# Patient Record
Sex: Male | Born: 1970 | Race: Black or African American | Hispanic: No | Smoking: Light tobacco smoker
Health system: Southern US, Community
[De-identification: ages and names within clinical notes are randomized; demographics above are authoritative.]

## PROBLEM LIST (undated history)

## (undated) DIAGNOSIS — I4891 Unspecified atrial fibrillation: Secondary | ICD-10-CM

## (undated) DIAGNOSIS — I10 Essential (primary) hypertension: Secondary | ICD-10-CM

## (undated) DIAGNOSIS — G4733 Obstructive sleep apnea (adult) (pediatric): Secondary | ICD-10-CM

---

## 1999-04-25 ENCOUNTER — Emergency Department (HOSPITAL_COMMUNITY): Admission: EM | Admit: 1999-04-25 | Discharge: 1999-04-25 | Payer: Self-pay | Admitting: Emergency Medicine

## 2007-11-16 ENCOUNTER — Ambulatory Visit (HOSPITAL_BASED_OUTPATIENT_CLINIC_OR_DEPARTMENT_OTHER): Admission: RE | Admit: 2007-11-16 | Discharge: 2007-11-16 | Payer: Self-pay | Admitting: Internal Medicine

## 2007-11-24 ENCOUNTER — Ambulatory Visit: Payer: Self-pay | Admitting: Internal Medicine

## 2011-02-21 NOTE — Procedures (Signed)
Jeffery Bowman, Jeffery Bowman          ACCOUNT NO.:  [id number]   MEDICAL RECORD NO.:  [id number]          PATIENT TYPE:  OUT   LOCATION:  SLEEP CENTER                 FACILITY:  [facility]   PHYSICIAN:  Clinton D. Maple Hudson, MD, FCCP, FACPDATE OF BIRTH:  31-May[birth year]1970   DATE OF STUDY:  11/16/2007                            NOCTURNAL POLYSOMNOGRAM   REFERRING PHYSICIAN:   REFERRING PHYSICIAN:  Ralene Ok, M.D.   INDICATION FOR STUDY:  Hypersomnia with sleep apnea.  BMI 46.4.  Weight  344 pounds.  Height 72 inches.  Neck 19 inches.   EPWORTH SLEEPINESS SCORE:   MEDICATIONS:  No home medications were charted.   SLEEP ARCHITECTURE:  Split study protocol.  During the diagnostic phase,  total sleep time was 140 minutes with sleep efficiency of 61.8%.  Stage  I was 13.2%.  Stage II 73.6%.  Stage III 3.6%.  REM 9.6% of total sleep  time.  Sleep latency 10.5 minutes.  REM latency 175.5 minutes.  Awake  after sleep onset 60 minutes.  Arousal index 16.3.  No bedtime  medication was taken.   RESPIRATORY DATA:  Split study protocol.  Apnea-hypopnea index (AHI) at  64.7 per hour, indicating severe obstructive sleep apnea/hypopnea  syndrome.  This included 26 obstructive apneas and 125 hypopneas before  CPAP.  The events were more common, while supine is expected, but not  limited to supine sleep position.  CPAP was titrated to a best pressure  of 16 CWP, AHI 0 per hour.  A large Mirage Quattro mask was used with  heated humidifier.   OXYGEN DATA:  Loud snoring before CPAP with oxygen desaturation to a  nadir of 71%.  Mean oxygen saturation after CPAP control was 95.7% on  room air.   CARDIAC DATA:  Normal sinus rhythm.   MOVEMENT-PARASOMNIA:  No significant movement disturbance.  Bathroom x1.   IMPRESSIONS-RECOMMENDATIONS:  1. Severe obstructive sleep apnea/hypopnea syndrome, apnea-hypopnea      index (AHI) 64.7 per hour with nonpositional events, somewhat more      frequent while supine, loud  snoring, and oxygen desaturation to a      nadir of 71%.  2. Successful CPAP titration to 16 CWP, AHI 3.4 per hour.  A large      Mirage Quattro mask was used with heated humidifier.      Clinton D. Maple Hudson, MD, [facility], FACP  Diplomate, Biomedical engineer of Sleep Medicine  Electronically Signed     CDY/MEDQ  D:  11/24/2007 10:13:06  T:  11/25/2007 16:47:57  Job:  [id number]   cc:   Ralene Ok, M.D.  Fax: [phone]

## 2017-03-28 ENCOUNTER — Other Ambulatory Visit: Payer: Self-pay | Admitting: Gastroenterology

## 2017-05-11 ENCOUNTER — Encounter (HOSPITAL_COMMUNITY): Payer: Self-pay | Admitting: *Deleted

## 2017-05-11 ENCOUNTER — Ambulatory Visit (HOSPITAL_COMMUNITY): Payer: BLUE CROSS/BLUE SHIELD | Admitting: Anesthesiology

## 2017-05-11 ENCOUNTER — Encounter (HOSPITAL_COMMUNITY)
Admission: RE | Disposition: A | Discharge status: Home / Self Care | Payer: Self-pay | Source: Ambulatory Visit | Attending: Gastroenterology

## 2017-05-11 ENCOUNTER — Ambulatory Visit (HOSPITAL_COMMUNITY)
Admission: RE | Admit: 2017-05-11 | Discharge: 2017-05-11 | Disposition: A | Discharge status: Home / Self Care | Payer: BLUE CROSS/BLUE SHIELD | Source: Ambulatory Visit | Attending: Gastroenterology | Admitting: Gastroenterology

## 2017-05-11 DIAGNOSIS — Z79899 Other long term (current) drug therapy: Secondary | ICD-10-CM | POA: Insufficient documentation

## 2017-05-11 DIAGNOSIS — Z6841 Body Mass Index (BMI) 40.0 and over, adult: Secondary | ICD-10-CM | POA: Insufficient documentation

## 2017-05-11 DIAGNOSIS — I10 Essential (primary) hypertension: Secondary | ICD-10-CM | POA: Insufficient documentation

## 2017-05-11 DIAGNOSIS — F17200 Nicotine dependence, unspecified, uncomplicated: Secondary | ICD-10-CM | POA: Insufficient documentation

## 2017-05-11 DIAGNOSIS — Z1211 Encounter for screening for malignant neoplasm of colon: Secondary | ICD-10-CM | POA: Insufficient documentation

## 2017-05-11 HISTORY — DX: Essential (primary) hypertension: I10

## 2017-05-11 HISTORY — PX: COLONOSCOPY WITH PROPOFOL: SHX5780

## 2017-05-11 SURGERY — COLONOSCOPY WITH PROPOFOL
Anesthesia: Monitor Anesthesia Care

## 2017-05-11 MED ORDER — LACTATED RINGERS IV SOLN
INTRAVENOUS | Status: DC
  Administered 2017-05-11: 07:00:00 via INTRAVENOUS

## 2017-05-11 MED ORDER — PROPOFOL 10 MG/ML IV BOLUS
INTRAVENOUS | Status: DC | PRN
  Administered 2017-05-11: 40 mg via INTRAVENOUS
  Administered 2017-05-11 (×7): 20 mg via INTRAVENOUS
  Administered 2017-05-11: 40 mg via INTRAVENOUS
  Administered 2017-05-11 (×7): 20 mg via INTRAVENOUS
  Administered 2017-05-11: 40 mg via INTRAVENOUS

## 2017-05-11 MED ORDER — PROPOFOL 10 MG/ML IV BOLUS
INTRAVENOUS | Status: AC
  Filled 2017-05-11: qty 40

## 2017-05-11 MED ORDER — SODIUM CHLORIDE 0.9 % IV SOLN: INTRAVENOUS | Status: DC

## 2017-05-11 SURGICAL SUPPLY — 22 items

## 2017-05-11 NOTE — Op Note (Signed)
[facility] Patient Name: Jeffery Bowman Procedure Date: 05/11/2017 MRN: [id number] Attending MD: Jeani HawkingPatrick Jancarlo Biermann , MD Date of Birth: [birth year] CSN: [id number] Age: 46 Admit Type: Outpatient Procedure:                Colonoscopy Indications:              Screening for colorectal malignant neoplasm Providers:                Jeani HawkingPatrick Zayvien Canning, MD, Omelia BlackwaterShelby Carpenter RN, RN, Margo AyeValeria                            McKoy, Technician Referring MD:              Medicines:                Propofol per Anesthesia Complications:            No immediate complications. Estimated Blood Loss:     Estimated blood loss: none. Procedure:                Pre-Anesthesia Assessment:                           - Prior to the procedure, a History and Physical                            was performed, and patient medications and                            allergies were reviewed. The patient's tolerance of                            previous anesthesia was also reviewed. The risks                            and benefits of the procedure and the sedation                            options and risks were discussed with the patient.                            All questions were answered, and informed consent                            was obtained. Prior Anticoagulants: The patient has                            taken no previous anticoagulant or antiplatelet                            agents. ASA Grade Assessment: II - A patient with                            mild systemic disease. After reviewing the risks  and benefits, the patient was deemed in                            satisfactory condition to undergo the procedure.                           - Sedation was administered by an anesthesia                            professional. Deep sedation was attained.                           After obtaining informed consent, the colonoscope                            was passed under  direct vision. Throughout the                            procedure, the patient's blood pressure, pulse, and                            oxygen saturations were monitored continuously. The                            Colonoscope was introduced through the anus and                            advanced to the the cecum, identified by                            appendiceal orifice and ileocecal valve. The                            colonoscopy was performed without difficulty. The                            patient tolerated the procedure well. The quality                            of the bowel preparation was excellent. The                            ileocecal valve, appendiceal orifice, and rectum                            were photographed. Scope In: 8:28:35 AM Scope Out: 8:43:11 AM Scope Withdrawal Time: 0 hours 10 minutes 40 seconds  Total Procedure Duration: 0 hours 14 minutes 36 seconds  Findings:      The entire examined colon appeared normal. Impression:               - The entire examined colon is normal.                           - No specimens collected. Moderate Sedation:  N/A- Per Anesthesia Care Recommendation:           - Patient has a contact number available for                            emergencies. The signs and symptoms of potential                            delayed complications were discussed with the                            patient. Return to normal activities tomorrow.                            Written discharge instructions were provided to the                            patient.                           - Resume previous diet.                           - Continue present medications.                           - Repeat colonoscopy in 10 years for surveillance. Procedure Code(s):        --- Professional ---                           [phone], Colonoscopy, flexible; diagnostic, including                            collection of specimen(s) by brushing or  washing,                            when performed (separate procedure) Diagnosis Code(s):        --- Professional ---                           Z12.11, Encounter for screening for malignant                            neoplasm of colon CPT copyright 2016 American Medical Association. All rights reserved. The codes documented in this report are preliminary and upon coder review may  be revised to meet current compliance requirements. Jeani Hawking, MD Jeani Hawking, MD 05/11/2017 8:47:43 AM This report has been signed electronically. Number of Addenda: 0

## 2017-05-11 NOTE — Anesthesia Postprocedure Evaluation (Signed)
Anesthesia Post Note  Patient: Mining engineer  Procedure(s) Performed: Procedure(s) (LRB): COLONOSCOPY WITH PROPOFOL (N/A)     Patient location during evaluation: Endoscopy Anesthesia Type: MAC Level of consciousness: awake and alert Pain management: pain level controlled Vital Signs Assessment: post-procedure vital signs reviewed and stable Respiratory status: spontaneous breathing, nonlabored ventilation, respiratory function stable and patient connected to nasal cannula oxygen Cardiovascular status: stable and blood pressure returned to baseline Anesthetic complications: no    Last Vitals:  Vitals:   05/11/17 0848 05/11/17 0900  BP: 119/77 121/77  Pulse: 88 73  Resp: (!) 22 14  Temp: 36.4 C     Last Pain:  Vitals:   05/11/17 0848  TempSrc: Oral                 Montez Hageman

## 2017-05-11 NOTE — Anesthesia Preprocedure Evaluation (Signed)
Anesthesia Evaluation  Patient identified by MRN, date of birth, ID band Patient awake    Reviewed: Allergy & Precautions, NPO status , Patient's Chart, lab work & pertinent test results  Airway Mallampati: II  TM Distance: >3 FB Neck ROM: Full    Dental no notable dental hx.    Pulmonary Current Smoker,    Pulmonary exam normal breath sounds clear to auscultation       Cardiovascular hypertension, Pt. on medications Normal cardiovascular exam Rhythm:Regular Rate:Normal     Neuro/Psych negative neurological ROS  negative psych ROS   GI/Hepatic negative GI ROS, Neg liver ROS,   Endo/Other  Morbid obesity  Renal/GU negative Renal ROS  negative genitourinary   Musculoskeletal negative musculoskeletal ROS (+)   Abdominal   Peds negative pediatric ROS (+)  Hematology negative hematology ROS (+)   Anesthesia Other Findings   Reproductive/Obstetrics negative OB ROS                             Anesthesia Physical Anesthesia Plan  ASA: III  Anesthesia Plan: MAC   Post-op Pain Management:    Induction:   PONV Risk Score and Plan: Treatment may vary due to age or medical condition  Airway Management Planned: Simple Face Mask  Additional Equipment:   Intra-op Plan:   Post-operative Plan:   Informed Consent: I have reviewed the patients History and Physical, chart, labs and discussed the procedure including the risks, benefits and alternatives for the proposed anesthesia with the patient or authorized representative who has indicated his/her understanding and acceptance.   Dental advisory given  Plan Discussed with: CRNA  Anesthesia Plan Comments:         Anesthesia Quick Evaluation

## 2017-05-11 NOTE — Transfer of Care (Signed)
Immediate Anesthesia Transfer of Care Note  Patient: Jeffery Bowman  Procedure(s) Performed: Procedure(s): COLONOSCOPY WITH PROPOFOL (N/A)  Patient Location: PACU  Anesthesia Type:MAC  Level of Consciousness: awake  Airway & Oxygen Therapy: Patient Spontanous Breathing and Patient connected to nasal cannula oxygen  Post-op Assessment: Report given to RN and Post -op Vital signs reviewed and stable  Post vital signs: Reviewed and stable  Last Vitals:  Vitals:   05/11/17 0711  BP: (!) 144/86  Pulse: 88  Resp: 11  Temp: 36.7 C    Last Pain:  Vitals:   05/11/17 0711  TempSrc: Oral         Complications: No apparent anesthesia complications

## 2017-05-11 NOTE — Discharge Instructions (Signed)

## 2017-05-11 NOTE — H&P (Signed)
Khaled Tilley HPI:  At this time the patient denies any problems with nausea, vomiting, fevers, chills, abdominal pain, diarrhea, constipation, hematochezia, melena, GERD, or dysphagia. The patient denies any known family history of colon cancers. No complaints of chest pain, SOB, MI, or sleep apnea.  Past Medical History:  Diagnosis Date  . Hypertension     History reviewed. No pertinent surgical history.  History reviewed. No pertinent family history.  Social History:  reports that he has been smoking Cigars.  He has never used smokeless tobacco. His alcohol and drug histories are not on file.  Allergies: No Known Allergies  Medications:  Scheduled:  Continuous: . lactated ringers 20 mL/hr at 05/11/17 0717    No results found for this or any previous visit (from the past 24 hour(s)).   No results found.  ROS:  As stated above in the HPI otherwise negative.  Blood pressure (!) 144/86, pulse 88, temperature 98 F (36.7 C), temperature source Oral, resp. rate 11, height 6\' 2"  (1.88 m), weight (!) 172.4 kg (380 lb), SpO2 100 %.    PE: Gen: NAD, Alert and Oriented HEENT:  Irena/AT, EOMI Neck: Supple, no LAD Lungs: CTA Bilaterally CV: RRR without M/G/R ABM: Soft, NTND, +BS Ext: No C/C/E  Assessment/Plan: 1) Screening colonoscopy.  Sulamita Lafountain D 05/11/2017, 8:16 AM

## 2018-07-19 ENCOUNTER — Encounter (HOSPITAL_COMMUNITY): Payer: Self-pay | Admitting: *Deleted

## 2018-07-19 ENCOUNTER — Ambulatory Visit (HOSPITAL_COMMUNITY): Payer: BLUE CROSS/BLUE SHIELD | Admitting: Anesthesiology

## 2018-07-19 ENCOUNTER — Other Ambulatory Visit: Payer: Self-pay

## 2018-07-19 ENCOUNTER — Ambulatory Visit (HOSPITAL_COMMUNITY)
Admission: RE | Admit: 2018-07-19 | Discharge: 2018-07-19 | Disposition: A | Discharge status: Home / Self Care | Payer: BLUE CROSS/BLUE SHIELD | Source: Ambulatory Visit | Attending: Cardiology | Admitting: Cardiology

## 2018-07-19 ENCOUNTER — Encounter (HOSPITAL_COMMUNITY)
Admission: RE | Disposition: A | Discharge status: Home / Self Care | Payer: Self-pay | Source: Ambulatory Visit | Attending: Cardiology

## 2018-07-19 DIAGNOSIS — I10 Essential (primary) hypertension: Secondary | ICD-10-CM | POA: Diagnosis not present

## 2018-07-19 DIAGNOSIS — Z8249 Family history of ischemic heart disease and other diseases of the circulatory system: Secondary | ICD-10-CM | POA: Insufficient documentation

## 2018-07-19 DIAGNOSIS — Z6841 Body Mass Index (BMI) 40.0 and over, adult: Secondary | ICD-10-CM | POA: Diagnosis not present

## 2018-07-19 DIAGNOSIS — G4733 Obstructive sleep apnea (adult) (pediatric): Secondary | ICD-10-CM | POA: Insufficient documentation

## 2018-07-19 DIAGNOSIS — I4891 Unspecified atrial fibrillation: Secondary | ICD-10-CM | POA: Diagnosis present

## 2018-07-19 DIAGNOSIS — F17290 Nicotine dependence, other tobacco product, uncomplicated: Secondary | ICD-10-CM | POA: Diagnosis not present

## 2018-07-19 DIAGNOSIS — Z79899 Other long term (current) drug therapy: Secondary | ICD-10-CM | POA: Diagnosis not present

## 2018-07-19 DIAGNOSIS — I4819 Other persistent atrial fibrillation: Secondary | ICD-10-CM | POA: Diagnosis present

## 2018-07-19 DIAGNOSIS — Z7901 Long term (current) use of anticoagulants: Secondary | ICD-10-CM | POA: Diagnosis not present

## 2018-07-19 DIAGNOSIS — I4811 Longstanding persistent atrial fibrillation: Secondary | ICD-10-CM | POA: Diagnosis present

## 2018-07-19 HISTORY — PX: CARDIOVERSION: SHX1299

## 2018-07-19 HISTORY — DX: Unspecified atrial fibrillation: I48.91

## 2018-07-19 SURGERY — CARDIOVERSION
Anesthesia: General

## 2018-07-19 MED ORDER — PROPOFOL 10 MG/ML IV BOLUS
INTRAVENOUS | Status: DC | PRN
  Administered 2018-07-19: 100 mg via INTRAVENOUS
  Administered 2018-07-19: 30 mg via INTRAVENOUS
  Administered 2018-07-19: 50 mg via INTRAVENOUS

## 2018-07-19 MED ORDER — LIDOCAINE 2% (20 MG/ML) 5 ML SYRINGE
INTRAMUSCULAR | Status: DC | PRN
  Administered 2018-07-19: 80 mg via INTRAVENOUS

## 2018-07-19 MED ORDER — SODIUM CHLORIDE 0.9 % IV SOLN
INTRAVENOUS | Status: DC | PRN
  Administered 2018-07-19: 09:00:00 via INTRAVENOUS

## 2018-07-19 NOTE — Transfer of Care (Signed)
Immediate Anesthesia Transfer of Care Note  Patient: Jeffery Bowman  Procedure(s) Performed: CARDIOVERSION (N/A )  Patient Location: Endoscopy Unit  Anesthesia Type:General  Level of Consciousness: awake, oriented and patient cooperative  Airway & Oxygen Therapy: Patient Spontanous Breathing and Patient connected to nasal cannula oxygen  Post-op Assessment: Report given to RN and Post -op Vital signs reviewed and stable  Post vital signs: Reviewed  Last Vitals:  Vitals Value Taken Time  BP 118/80 07/19/2018  9:32 AM  Temp 36.6 C 07/19/2018  9:32 AM  Pulse 79 07/19/2018  9:32 AM  Resp 15 07/19/2018  9:32 AM  SpO2 98 % 07/19/2018  9:32 AM    Last Pain:  Vitals:   07/19/18 0932  TempSrc: Oral  PainSc: 0-No pain         Complications: No apparent anesthesia complications

## 2018-07-19 NOTE — CV Procedure (Signed)
Direct current cardioversion:  Indication symptomatic A. Fibrillation.  Procedure: Using 175 mg of IV Propofol and 80 IV Lidocaine (for reducing venous pain) for achieving deep sedation, synchronized direct current cardioversion performed. Patient was delivered with 120 then 150 Joules of electricity X 1 with success to NSR. Patient tolerated the procedure well. No immediate complication noted.

## 2018-07-19 NOTE — H&P (Signed)
OFFICE VISIT NOTES COPIED TO EPIC FOR DOCUMENTATION  . istory of Present Illness Jeffery Maine FNP-C; 14-Jul-2018 10:34 PM) Patient words: Last O/V 05/01/2018; F/U Echo results - Pt has been taking Metoprolol 55m/qd but was supposed to be taking it 573mBID.  The patient is a [age]ear old male who presents for a Follow-up for Atrial fibrillation. Patient hypertension, morbid obesity, family history of heart disease, sleep apnea noncompliant with CPAP, and tobacco use recently evaluated by me for evaluation of new onset atrial fibrillation. He underwent echocardiogram and presents to disucss results.  Metoprolol was increased to 5057mt last office visit due to RVR; however, has only been taking once a day. Fatigue has improved since last seen by us.Koreae has continued to exercise and has lost some weight. Tolerating all medications well. He does continue to smoke 1 black and mild a day, but is working to completely quit soon. He was referred back to neurololgy for reevaluation of sleep apnea at last offive visit, but has not been contacted to schedule appointment.     Problem List/Past Medical (April Garrison; 9/110-03-1924 PM) Laboratory examination (Z01.89)  Labs 04/24/2018: EGFR 106/92, potassium 4.2, creatinine 0.97, he CMP normal. CBC normal. TSH normal 1.13. Cholesterol 152, triglycerides 78, HDL 52, LDL 84. Atrial fibrillation with controlled ventricular response (I48.91)  EKG 05/01/2018: Atrial fibrillation 102 bpm. Normal axis, normal conduction. Possible anteroseptal infarct. Poor R wave progression. No ischemic changes. Echocardiogram 05/29/2018: Left ventricle cavity is normal in size. Mild asymmetric hypertrophy of the left ventricle. Normal global wall motion. Unable to evaluate diastolic function due to A. Fibrillation. Calculated EF 57%. Aortic root mildly dilated, measuring 4.0 cm in AP dimension. Left atrial cavity is moderately dilated. Inadequate tricuspid regurgitation jet to  estimate pulmonary artery pressure. Normal right atrial pressure. Obesity, morbid, BMI 50 or higher (E66.01)  Family history of heart disease (Z82.49)  Benign essential hypertension (I10)  Tobacco use disorder (F17.200)  Sleep apnea, obstructive (G47.33)  Currently not on CPAP  Allergies (April Garrison; 9/110-03-1924 PM) No Known Drug Allergies [05/01/2018]:  Family History (April Garrison; 9/1October 06, 201925 PM) Mother  In stable health. DM; No heart issues Father  Deceased. at age 67;80eart transplant Sister 1  In good health. older; No heart issues  Social History (April Garrison; 9/1Oct 06, 201925 PM) Current tobacco use  Current every day smoker. 2 black and milds daily; cigars Alcohol Use  Occasional alcohol use. Marital status  Single. Living Situation  Lives with relatives. Number of Children  1.  Past Surgical History (April GarLouretta Shorten/110/06/201925 PM) None [05/01/2018]:  Medication History (AsGwinda MaineNP-C; 9/110/06/19:34 PM) Metoprolol Tartrate (50MG Tablet, 1 (one) Oral daily, Taken starting 05/01/2018) Active. (dose increased) Eliquis (5MG Tablet, 1 Tablet Oral two times daily, Taken starting 05/01/2018) Active. Lisinopril-hydroCHLOROthiazide (20-12.5MG Tablet, 1 tablet Oral daily) Active. Medications Reconciled (verbally)  Diagnostic Studies History (April Garrison; 9/12019-07-629 PM) Echocardiogram  05/29/2018: Left ventricle cavity is normal in size. Mild asymmetric hypertrophy of the left ventricle. Normal global wall motion. Unable to evaluate diastolic function due to A. Fibrillation. Calculated EF 57%. Aortic root mildly dilated, measuring 4.0 cm in AP dimension. Left atrial cavity is moderately dilated. Inadequate tricuspid regurgitation jet to estimate pulmonary artery pressure. Normal right atrial pressure.    Review of Systems (AsGwinda MaineP-C; 9/1October 06, 201913 PM) General Present- Fatigue (improving). Not Present-  Appetite Loss and Weight Gain. Respiratory Not Present- Chronic Cough and Wakes up from Sleep  Wheezing or Short of Breath. Cardiovascular Present- Edema (occasional at the end of the day). Not Present- Chest Pain, Difficulty Breathing Lying Down, Difficulty Breathing On Exertion and Palpitations. Gastrointestinal Not Present- Black, Tarry Stool and Difficulty Swallowing. Musculoskeletal Not Present- Decreased Range of Motion and Muscle Atrophy. Neurological Not Present- Attention Deficit. Psychiatric Not Present- Personality Changes and Suicidal Ideation. Endocrine Not Present- Cold Intolerance and Heat Intolerance. Hematology Not Present- Abnormal Bleeding. All other systems negative  Vitals (April Garrison; 06/26/2018 2:35 PM) 06/26/2018 2:27 PM Weight: 371.44 lb Height: 73in Body Surface Area: 2.8 m Body Mass Index: 49 kg/m  Pulse: 85 (Regular)  P.OX: 96% (Room air) BP: 115/78 (Sitting, Right Arm, Large)       Physical Exam Jeffery Maine, FNP-C; 06/26/2018 10:42 PM) General Mental Status-Alert. General Appearance-Cooperative, Appears stated age, Not in acute distress. Build & Nutrition-Well built and Morbidly obese.  Head and Neck Neck -Note: Short neck and difficult to evaluate JVD.  Thyroid Gland Characteristics - no palpable nodules, no palpable enlargement.  Cardiovascular Inspection Jugular vein - Right - No Distention. Auscultation Rhythm - Irregular. Heart Sounds - S1 is variable.  Abdomen Inspection Contour - Obese and Pannus present. Palpation/Percussion Normal exam - Non Tender and No hepatosplenomegaly. Auscultation Normal exam - Bowel sounds normal.  Peripheral Vascular Lower Extremity Inspection - Bilateral - Inspection Normal. Palpation - Edema - Bilateral - No edema. Femoral pulse - Bilateral - Feeble(Pulsus difficult to feel due to patient's bodily habitus.), No Bruits. Popliteal pulse - Bilateral - Feeble(Pulsus difficult  to feel due to patient's bodily habitus.). Dorsalis pedis pulse - Bilateral - Normal. Posterior tibial pulse - Bilateral - Normal. Carotid arteries - Bilateral-No Carotid bruit.  Neurologic Neurologic evaluation reveals -alert and oriented x 3 with no impairment of recent or remote memory. Motor-Grossly intact without any focal deficits.  Musculoskeletal Global Assessment Left Lower Extremity - normal range of motion without pain. Right Lower Extremity - normal range of motion without pain.    Assessment & Plan Jeffery Maine FNP-C; 06/26/2018 10:41 PM) Atrial fibrillation with controlled ventricular response (I48.91) Story: CHA2DS2-VASc Score is 1 with yearly risk of stroke of 1.3 %.  EKG 06/26/2018: Atrial fibrilation with rapid ventricular rate 101 bpm. Normal axis. Normal conduction. Possible anteroseptal infarct. Poor R wave progression.  Echocardiogram 05/29/2018: Left ventricle cavity is normal in size. Mild asymmetric hypertrophy of the left ventricle. Normal global wall motion. Unable to evaluate diastolic function due to A. Fibrillation. Calculated EF 57%. Aortic root mildly dilated, measuring 4.0 cm in AP dimension. Left atrial cavity is moderately dilated. Inadequate tricuspid regurgitation jet to estimate pulmonary artery pressure. Normal right atrial pressure.  Changed Metoprolol Tartrate 50MG, 1 (one) Tablet twice a day, #30, 06/26/2018, Ref. x1.  Obesity, morbid, BMI 50 or higher (E66.01) Family history of heart disease (Z82.49) Benign essential hypertension (I10) Tobacco use disorder (F17.200) Sleep apnea, obstructive (G47.33) Story: Currently not on CPAP Laboratory examination (Z01.89) 07/04/2018: Creatinine 0.96, EGFR 93/108, potassium 3.9, BMP normal.  Labs 04/24/2018: EGFR 106/92, potassium 4.2, creatinine 0.97, he CMP normal.  CBC normal.  TSH normal 1.13.  Cholesterol 152, triglycerides 78, HDL 52, LDL 84.  Note:. Recommendation:  Atrial  fibrillation: Patient continues to have symptomatic atrial fibrillation; however, fatigue is improving. Heart rate has only minimally improved, but will improve with taking Metoprolol twice a day. Echocardiogram results were discussed, LVEF is normal and has moderate left atrial size. Tolerating anticoagulation well, no bleeding diathesis. As he has been on anticoagulation  for greater than 4 weeks, feel that we can proceed with cardioversion. Schedule for Direct current cardioversion. I have discussed regarding risks benefits rate control vs rhythm control with the patient. Patient understands cardiac arrest and need for CPR, aspiration pneumonia, but not limited to these. Patient is willing. It will be scheduled for in the next few weeks. He will also be scheduled for GXT to exclude ischemic etiology of new onset A fib to be performed after cardioversion. Have low suspicion for ischemic etiology for A fib as he has other risk factors such as obesity, sleep apnea, and hypertension that are likely etiology.  Obesity: He has been working to lose weight and has lost 10 lbs. I have congratulated him on his efforts and provided positive reinforcement. Will continue to monitor his progress.  Family history of CAD: No symtpoms of angina. Does have risk factors for CAD. Will obtain GXT for evaluation.  HTN: Has improved with increased dose of metoprolol. Continue the same.  Tobacco use disorder: Unfortunatley, contiues to smoke, but is making efforts to quit and appears motivated. Will continue to monitor progress.  Sleep apnea: Likely etiology for new onset A fib along with obesity. Will need referral to Neurology for re-evaluation as it has been 5 years since last sleep study.  OV after cardioversion and GXT for reevaluation.  *I have discussed this case with Dr. Virgina Jock and he personally examined the patient and participated in formulating the plan.*  CC: Dr. Jilda Panda (PCP)  Signed by Jeffery Maine, FNP-C (06/26/2018 10:42 PM)

## 2018-07-19 NOTE — Discharge Instructions (Signed)

## 2018-07-19 NOTE — Anesthesia Preprocedure Evaluation (Addendum)
Anesthesia Evaluation  Patient identified by MRN, date of birth, ID band Patient awake    Reviewed: Allergy & Precautions, NPO status , Patient's Chart, lab work & pertinent test results, reviewed documented beta blocker date and time   Airway Mallampati: III  TM Distance: >3 FB Neck ROM: Full    Dental no notable dental hx.    Pulmonary Current Smoker,    Pulmonary exam normal breath sounds clear to auscultation       Cardiovascular hypertension, Pt. on medications and Pt. on home beta blockers + dysrhythmias Atrial Fibrillation  Rhythm:Irregular Rate:Normal     Neuro/Psych negative neurological ROS  negative psych ROS   GI/Hepatic negative GI ROS, Neg liver ROS,   Endo/Other  Morbid obesity  Renal/GU negative Renal ROS     Musculoskeletal negative musculoskeletal ROS (+)   Abdominal (+) + obese,   Peds  Hematology negative hematology ROS (+)   Anesthesia Other Findings A-FIB  Reproductive/Obstetrics                            Anesthesia Physical Anesthesia Plan  ASA: III  Anesthesia Plan: General   Post-op Pain Management:    Induction: Intravenous  PONV Risk Score and Plan: 1 and Propofol infusion and Treatment may vary due to age or medical condition  Airway Management Planned: Mask  Additional Equipment:   Intra-op Plan:   Post-operative Plan:   Informed Consent: I have reviewed the patients History and Physical, chart, labs and discussed the procedure including the risks, benefits and alternatives for the proposed anesthesia with the patient or authorized representative who has indicated his/her understanding and acceptance.   Dental advisory given  Plan Discussed with: CRNA  Anesthesia Plan Comments:         Anesthesia Quick Evaluation

## 2018-07-19 NOTE — Interval H&P Note (Signed)
History and Physical Interval Note:  07/19/2018 9:11 AM  Birdie Hopes  has presented today for surgery, with the diagnosis of A-FIB  The various methods of treatment have been discussed with the patient and family. After consideration of risks, benefits and other options for treatment, the patient has consented to  Procedure(s): CARDIOVERSION (N/A) as a surgical intervention .  The patient's history has been reviewed, patient examined, no change in status, stable for surgery.  I have reviewed the patient's chart and labs.  Questions were answered to the patient's satisfaction.     Yates Decamp

## 2018-07-19 NOTE — Anesthesia Postprocedure Evaluation (Signed)
Anesthesia Post Note  Patient: Surveyor, quantity  Procedure(s) Performed: CARDIOVERSION (N/A )     Patient location during evaluation: PACU Anesthesia Type: General Level of consciousness: awake and alert Pain management: pain level controlled Vital Signs Assessment: post-procedure vital signs reviewed and stable Respiratory status: spontaneous breathing, nonlabored ventilation, respiratory function stable and patient connected to nasal cannula oxygen Cardiovascular status: blood pressure returned to baseline and stable Postop Assessment: no apparent nausea or vomiting Anesthetic complications: no    Last Vitals:  Vitals:   07/19/18 0937 07/19/18 0940  BP: 122/90   Pulse: 65 70  Resp: 19 (!) 21  Temp:    SpO2: 97% 96%    Last Pain:  Vitals:   07/19/18 0937  TempSrc:   PainSc: 0-No pain                 Ryan P Ellender

## 2018-07-19 NOTE — Anesthesia Procedure Notes (Signed)
Procedure Name: General with mask airway Date/Time: 07/19/2018 9:15 AM Performed by: Elliot Dally, CRNA Pre-anesthesia Checklist: Patient identified, Emergency Drugs available, Suction available and Patient being monitored Patient Re-evaluated:Patient Re-evaluated prior to induction Oxygen Delivery Method: Ambu bag Preoxygenation: Pre-oxygenation with 100% oxygen Induction Type: IV induction

## 2018-07-20 ENCOUNTER — Encounter (HOSPITAL_COMMUNITY): Payer: Self-pay | Admitting: Cardiology

## 2018-08-26 ENCOUNTER — Institutional Professional Consult (permissible substitution): Payer: Self-pay | Admitting: Neurology

## 2018-10-24 ENCOUNTER — Encounter (HOSPITAL_COMMUNITY): Payer: Self-pay | Admitting: Cardiology

## 2018-10-24 DIAGNOSIS — G4733 Obstructive sleep apnea (adult) (pediatric): Secondary | ICD-10-CM

## 2018-10-24 DIAGNOSIS — I10 Essential (primary) hypertension: Secondary | ICD-10-CM

## 2018-10-24 DIAGNOSIS — Z9989 Dependence on other enabling machines and devices: Secondary | ICD-10-CM

## 2018-10-24 HISTORY — DX: Morbid (severe) obesity due to excess calories: E66.01

## 2018-10-24 HISTORY — DX: Obstructive sleep apnea (adult) (pediatric): G47.33

## 2018-10-24 HISTORY — DX: Essential (primary) hypertension: I10

## 2018-10-24 NOTE — Anesthesia Preprocedure Evaluation (Addendum)
Anesthesia Evaluation  Patient identified by MRN, date of birth, ID band Patient awake    Reviewed: Allergy & Precautions, NPO status , Patient's Chart, lab work & pertinent test results  Airway Mallampati: II  TM Distance: >3 FB Neck ROM: Full    Dental no notable dental hx. (+) Teeth Intact, Dental Advisory Given   Pulmonary sleep apnea , Current Smoker, former smoker,    Pulmonary exam normal breath sounds clear to auscultation       Cardiovascular hypertension, Pt. on medications and Pt. on home beta blockers Normal cardiovascular exam+ dysrhythmias Atrial Fibrillation  Rhythm:Irregular Rate:Normal     Neuro/Psych negative neurological ROS  negative psych ROS   GI/Hepatic negative GI ROS, Neg liver ROS,   Endo/Other  Morbid obesity  Renal/GU negative Renal ROS  negative genitourinary   Musculoskeletal negative musculoskeletal ROS (+)   Abdominal   Peds  Hematology negative hematology ROS (+)   Anesthesia Other Findings On eliquis  Reproductive/Obstetrics                            Anesthesia Physical Anesthesia Plan  ASA: III  Anesthesia Plan: General   Post-op Pain Management:    Induction: Intravenous  PONV Risk Score and Plan: 1 and Propofol infusion  Airway Management Planned: Natural Airway  Additional Equipment:   Intra-op Plan:   Post-operative Plan:   Informed Consent: I have reviewed the patients History and Physical, chart, labs and discussed the procedure including the risks, benefits and alternatives for the proposed anesthesia with the patient or authorized representative who has indicated his/her understanding and acceptance.     Dental advisory given  Plan Discussed with: CRNA  Anesthesia Plan Comments:         Anesthesia Quick Evaluation

## 2018-10-24 NOTE — H&P (Signed)
Jeffery Bowman is an 48 y.o. male.   Chief Complaint: Atrial fibrillation  HPI:   48 year old African-American man with hypertension, obesity, obstructive sleep apnea noncompliant on CPAP, tobacco abuse, now with persistent atrial fibrillation.  Patient had successful cardioversion, was unable to maintain sinus rhythm thereafter.  He was started on flecainide with exercise treadmill stress test showing no arrhythmias.  Patient is here today for repeat attempt of cardioversion while on flecainide.    Past Medical History:  Diagnosis Date  . Atrial fibrillation ([place]) 07/19/2018  . Essential hypertension 10/24/2018  . Hypertension   . Morbid obesity ([place]) 10/24/2018  . OSA (obstructive sleep apnea) 10/24/2018    Past Surgical History:  Procedure Laterality Date  . CARDIOVERSION N/A 07/19/2018   Procedure: CARDIOVERSION;  Surgeon: Adrian Prows, MD;  Location: [place];  Service: Cardiovascular;  Laterality: N/A;  . COLONOSCOPY WITH PROPOFOL N/A 05/11/2017   Procedure: COLONOSCOPY WITH PROPOFOL;  Surgeon: Carol Ada, MD;  Location: [facility];  Service: Endoscopy;  Laterality: N/A;    Family History  Problem Relation Age of Onset  . Diabetes Mother   . Heart disease Father        Had heart transplant   Social History:  reports that he has been smoking cigars and cigarettes. He has never used smokeless tobacco. He reports current alcohol use. He reports that he does not use drugs.  Allergies: No Known Allergies  Review of Systems  Constitutional: Negative.   HENT: Negative.   Respiratory: Negative.  Negative for cough and shortness of breath.   Cardiovascular: Negative for chest pain and palpitations.  Gastrointestinal: Negative.   Genitourinary: Negative.   Skin: Negative.   Endo/Heme/Allergies: Does not bruise/bleed easily.  Psychiatric/Behavioral: Negative.   All other systems reviewed and are negative.  Physical Exam  Constitutional: He is oriented to person,  place, and time. He appears well-developed and well-nourished. No distress.  Morbidly obese  HENT:  Head: Normocephalic and atraumatic.  Neck: No JVD present.  Cardiovascular: Intact distal pulses. An irregularly irregular rhythm present.  No murmur heard. Pulmonary/Chest: Effort normal and breath sounds normal. He has no wheezes. He has no rales.  Abdominal: Soft. Bowel sounds are normal.  Musculoskeletal:        General: No edema.  Lymphadenopathy:    He has no cervical adenopathy.  Neurological: He is alert and oriented to person, place, and time.  Skin: Skin is warm and dry.  Psychiatric: He has a normal mood and affect.  Nursing note and vitals reviewed.   There were no vitals taken for this visit. There is no height or weight on file to calculate BMI.   Labs 10/04/2018: Glucose 88. BUN/Cr 14/0.94. eGFR normal. Na/K144/4.2  No medications prior to admission.     No current facility-administered medications for this encounter.   Current Outpatient Medications:  .  ELIQUIS 5 MG TABS tablet, Take 5 mg by mouth 2 (two) times daily., Disp: , Rfl: 3 .  flecainide (TAMBOCOR) 50 MG tablet, Take 50 mg by mouth 2 (two) times daily., Disp: , Rfl:  .  lisinopril-hydrochlorothiazide (PRINZIDE,ZESTORETIC) 20-12.5 MG tablet, Take 1 tablet by mouth daily. , Disp: , Rfl:  .  metoprolol tartrate (LOPRESSOR) 50 MG tablet, Take 50 mg by mouth 2 (two) times daily., Disp: , Rfl: 1  CARDIAC STUDIES:  EKG 08/05/2018: Atrial fibrilation with controlled ventricular rate 83 bpm. Normal axis. Normal conduction. Possible old anteroseptal infarct. Poor R wave progression,   ECHO 05/29/2018: Echocardiogram 05/29/2018: Left  ventricle cavity is normal in size. Mild asymmetric hypertrophy of the left ventricle. Normal global wall motion. Unable to evaluate diastolic function due to A. Fibrillation. Calculated EF 57%. Aortic root mildly dilated, measuring 4.0 cm in AP dimension.  Left atrial cavity  is moderately dilated. Inadequate tricuspid regurgitation jet to estimate pulmonary artery pressure. Normal right atrial pressure.  Exercise treadmill stress test 10/04/2018:   Assessment: 48 year old African-American man with hypertension, obesity, obstructive sleep apnea noncompliant on CPAP, tobacco abuse, now with persistent atrial fibrillation.  Plan: Cardioversion  Nigel Mormon, MD 10/24/2018, 10:59 AM [facility] Pager: [phone] Office: [phone] If no answer: Cell:  [phone]

## 2018-10-25 ENCOUNTER — Other Ambulatory Visit: Payer: Self-pay

## 2018-10-25 ENCOUNTER — Encounter (HOSPITAL_COMMUNITY)
Admission: RE | Disposition: A | Discharge status: Home / Self Care | Payer: Self-pay | Source: Home / Self Care | Attending: Cardiology

## 2018-10-25 ENCOUNTER — Encounter (HOSPITAL_COMMUNITY): Payer: Self-pay | Admitting: *Deleted

## 2018-10-25 ENCOUNTER — Ambulatory Visit (HOSPITAL_COMMUNITY): Payer: BLUE CROSS/BLUE SHIELD | Admitting: Certified Registered Nurse Anesthetist

## 2018-10-25 ENCOUNTER — Ambulatory Visit (HOSPITAL_COMMUNITY)
Admission: RE | Admit: 2018-10-25 | Discharge: 2018-10-25 | Disposition: A | Discharge status: Home / Self Care | Payer: BLUE CROSS/BLUE SHIELD | Attending: Cardiology | Admitting: Cardiology

## 2018-10-25 DIAGNOSIS — Z9119 Patient's noncompliance with other medical treatment and regimen: Secondary | ICD-10-CM | POA: Diagnosis not present

## 2018-10-25 DIAGNOSIS — Z79899 Other long term (current) drug therapy: Secondary | ICD-10-CM | POA: Insufficient documentation

## 2018-10-25 DIAGNOSIS — Z6841 Body Mass Index (BMI) 40.0 and over, adult: Secondary | ICD-10-CM | POA: Diagnosis not present

## 2018-10-25 DIAGNOSIS — Z8249 Family history of ischemic heart disease and other diseases of the circulatory system: Secondary | ICD-10-CM | POA: Insufficient documentation

## 2018-10-25 DIAGNOSIS — I10 Essential (primary) hypertension: Secondary | ICD-10-CM | POA: Diagnosis present

## 2018-10-25 DIAGNOSIS — I4819 Other persistent atrial fibrillation: Secondary | ICD-10-CM | POA: Diagnosis not present

## 2018-10-25 DIAGNOSIS — G4733 Obstructive sleep apnea (adult) (pediatric): Secondary | ICD-10-CM | POA: Diagnosis present

## 2018-10-25 DIAGNOSIS — Z9989 Dependence on other enabling machines and devices: Secondary | ICD-10-CM

## 2018-10-25 DIAGNOSIS — F17210 Nicotine dependence, cigarettes, uncomplicated: Secondary | ICD-10-CM | POA: Diagnosis not present

## 2018-10-25 DIAGNOSIS — Z7901 Long term (current) use of anticoagulants: Secondary | ICD-10-CM | POA: Diagnosis not present

## 2018-10-25 DIAGNOSIS — F17290 Nicotine dependence, other tobacco product, uncomplicated: Secondary | ICD-10-CM | POA: Insufficient documentation

## 2018-10-25 HISTORY — PX: CARDIOVERSION: SHX1299

## 2018-10-25 HISTORY — DX: Morbid (severe) obesity due to excess calories: E66.01

## 2018-10-25 HISTORY — DX: Unspecified atrial fibrillation: I48.91

## 2018-10-25 HISTORY — DX: Obstructive sleep apnea (adult) (pediatric): G47.33

## 2018-10-25 HISTORY — DX: Essential (primary) hypertension: I10

## 2018-10-25 SURGERY — CARDIOVERSION
Anesthesia: General

## 2018-10-25 MED ORDER — LIDOCAINE 2% (20 MG/ML) 5 ML SYRINGE
INTRAMUSCULAR | Status: DC | PRN
  Administered 2018-10-25: 100 mg via INTRAVENOUS

## 2018-10-25 MED ORDER — SODIUM CHLORIDE 0.9 % IV SOLN
INTRAVENOUS | Status: AC | PRN
  Administered 2018-10-25: 500 mL via INTRAVENOUS

## 2018-10-25 MED ORDER — PROPOFOL 10 MG/ML IV BOLUS
INTRAVENOUS | Status: DC | PRN
  Administered 2018-10-25: 20 mg via INTRAVENOUS
  Administered 2018-10-25: 40 mg via INTRAVENOUS
  Administered 2018-10-25: 80 mg via INTRAVENOUS

## 2018-10-25 NOTE — CV Procedure (Signed)
Direct current cardioversion:  Indication symptomatic A. Fibrillation.  Procedure: Under deep sedation administered and monitored by anesthesiology, synchronized direct current cardioversion performed. Patient was delivered with 120, 150, 200, 200 Joules of electricity X 4 with success to NSR. Patient tolerated the procedure well. No immediate complication noted.   Elder Negus, MD [facility] Pager: [phone] Office: [phone] If no answer Cell [phone]

## 2018-10-25 NOTE — Interval H&P Note (Signed)
History and Physical Interval Note:  10/25/2018 7:30 AM  Jeffery Bowman  has presented today for surgery, with the diagnosis of AFIB  The various methods of treatment have been discussed with the patient and family. After consideration of risks, benefits and other options for treatment, the patient has consented to  Procedure(s): CARDIOVERSION (N/A) as a surgical intervention .  The patient's history has been reviewed, patient examined, no change in status, stable for surgery.  I have reviewed the patient's chart and labs.  Questions were answered to the patient's satisfaction.     Burns Timson J Shawnya Mayor

## 2018-10-25 NOTE — Transfer of Care (Signed)
Immediate Anesthesia Transfer of Care Note  Patient: Jeffery Bowman  Procedure(s) Performed: CARDIOVERSION (N/A )  Patient Location: Endoscopy Unit  Anesthesia Type:General  Level of Consciousness: awake, alert  and oriented  Airway & Oxygen Therapy: Patient Spontanous Breathing  Post-op Assessment: Report given to RN and Post -op Vital signs reviewed and stable  Post vital signs: Reviewed and stable  Last Vitals:  Vitals Value Taken Time  BP 150/92 10/25/2018  7:47 AM  Temp    Pulse 68 10/25/2018  7:47 AM  Resp 22 10/25/2018  7:49 AM  SpO2 100 % 10/25/2018  7:47 AM    Last Pain:  Vitals:   10/25/18 0656  TempSrc: Oral  PainSc: 0-No pain         Complications: No apparent anesthesia complications

## 2018-10-25 NOTE — Discharge Instructions (Signed)
Electrical Cardioversion, Care After °This sheet gives you information about how to care for yourself after your procedure. Your health care provider may also give you more specific instructions. If you have problems or questions, contact your health care provider. °What can I expect after the procedure? °After the procedure, it is common to have: °· Some redness on the skin where the shocks were given. °Follow these instructions at home: ° °· Do not drive for 24 hours if you were given a medicine to help you relax (sedative). °· Take over-the-counter and prescription medicines only as told by your health care provider. °· Ask your health care provider how to check your pulse. Check it often. °· Rest for 48 hours after the procedure or as told by your health care provider. °· Avoid or limit your caffeine use as told by your health care provider. °Contact a health care provider if: °· You feel like your heart is beating too quickly or your pulse is not regular. °· You have a serious muscle cramp that does not go away. °Get help right away if: ° °· You have discomfort in your chest. °· You are dizzy or you feel faint. °· You have trouble breathing or you are short of breath. °· Your speech is slurred. °· You have trouble moving an arm or leg on one side of your body. °· Your fingers or toes turn cold or blue. °This information is not intended to replace advice given to you by your health care provider. Make sure you discuss any questions you have with your health care provider. °Document Released: 07/16/2013 Document Revised: 04/28/2016 Document Reviewed: 03/31/2016 °Elsevier Interactive Patient Education © 2019 [employer] ° °

## 2018-10-25 NOTE — Anesthesia Procedure Notes (Signed)
Procedure Name: General with mask airway Date/Time: 10/25/2018 7:34 AM Performed by: Jed Limerick, CRNA Pre-anesthesia Checklist: Patient identified, Emergency Drugs available, Suction available, Patient being monitored and Timeout performed Patient Re-evaluated:Patient Re-evaluated prior to induction Oxygen Delivery Method: Ambu bag Preoxygenation: Pre-oxygenation with 100% oxygen Induction Type: IV induction Dental Injury: Teeth and Oropharynx as per pre-operative assessment

## 2018-10-25 NOTE — Anesthesia Postprocedure Evaluation (Signed)
Anesthesia Post Note  Patient: Surveyor, quantity  Procedure(s) Performed: CARDIOVERSION (N/A )     Patient location during evaluation: PACU Anesthesia Type: General Level of consciousness: awake and alert Pain management: pain level controlled Vital Signs Assessment: post-procedure vital signs reviewed and stable Respiratory status: spontaneous breathing, nonlabored ventilation, respiratory function stable and patient connected to nasal cannula oxygen Cardiovascular status: blood pressure returned to baseline and stable Postop Assessment: no apparent nausea or vomiting Anesthetic complications: no    Last Vitals:  Vitals:   10/25/18 0810 10/25/18 0815  BP: (!) 155/101 (!) 153/102  Pulse: 66 66  Resp: 16 (!) 21  Temp:    SpO2: 100% 98%    Last Pain:  Vitals:   10/25/18 0815  TempSrc:   PainSc: 0-No pain                 Lennon Richins L Kahleb Mcclane

## 2018-10-28 ENCOUNTER — Encounter (HOSPITAL_COMMUNITY): Payer: Self-pay | Admitting: Cardiology

## 2018-11-22 ENCOUNTER — Other Ambulatory Visit: Payer: Self-pay

## 2018-11-22 ENCOUNTER — Other Ambulatory Visit: Payer: Self-pay | Admitting: Cardiology

## 2018-11-22 MED ORDER — FLECAINIDE ACETATE 50 MG PO TABS: 50.0000 mg | ORAL_TABLET | Freq: Two times a day (BID) | ORAL | 1 refills | Status: DC

## 2018-11-22 NOTE — Telephone Encounter (Signed)
Ia this ok to send?

## 2018-12-19 NOTE — Progress Notes (Signed)
Patient is here for follow up visit.  Subjective:    ID: Jeffery Bowman, male    DOB: [birth year], 48 y.o.   MRN: [id number]  Chief Complaint  Patient presents with  . Atrial Fibrillation  . Hypertension  . Follow-up     after cardioversion     HPI  48 year old African-American man with hypertension, obesity, obstructive sleep apnea noncompliant on CPAP, tobacco abuse, s/p cardioversion for persistent atrial fibrillation.  Patient had successful cardioversion in 07/2018, was unable to maintain sinus rhythm thereafter.  He was started on flecainide with exercise treadmill stress test showing no arrhythmias.  Patient then underwent successful cardioversion in 10/2018. Patient is here for follow up visit.   He denies chest pain, shortness of breath, palpitations, leg edema, orthopnea, PND, TIA/syncope. However, he is in atrial fibrillation.  Past Medical History:  Diagnosis Date  . Atrial fibrillation ([facility]) 07/19/2018  . Essential hypertension 10/24/2018  . Hypertension   . Morbid obesity ([facility]) 10/24/2018  . OSA (obstructive sleep apnea) 10/24/2018     Past Surgical History:  Procedure Laterality Date  . CARDIOVERSION N/A 07/19/2018   Procedure: CARDIOVERSION;  Surgeon: Yates Decamp, MD;  Location: [facility] END[facility]COPY;  Service: Cardiovascular;  Laterality: N/A;  . CARDIOVERSION N/A 10/25/2018   Procedure: CARDIOVERSION;  Surgeon: Elder Negus, MD;  Location: [facility] ENDOSCOPY;  Service: Cardiovascular;  Laterality: N/A;  . COLONOSCOPY WITH PROPOFOL N/A 05/11/2017   Procedure: COLONOSCOPY WITH PROPOFOL;  Surgeon: Jeani Hawking, MD;  Location: [facility];  Service: Endoscopy;  Laterality: N/A;     Social History   Socioeconomic History  . Marital status: Divorced    Spouse name: Not on file  . Number of children: 1  . Years of education: Not on file  . Highest education level: Not on file  Occupational History  . Not on file  Social Needs  . Financial resource  strain: Not on file  . Food insecurity:    Worry: Not on file    Inability: Not on file  . Transportation needs:    Medical: Not on file    Non-medical: Not on file  Tobacco Use  . Smoking status: Light Tobacco Smoker    Types: Cigars    Last attempt to quit: 10/09/2018    Years since quitting: 0.1  . Smokeless tobacco: Never Used  Substance and Sexual Activity  . Alcohol use: Yes    Comment: Occasional  . Drug use: Never  . Sexual activity: Not on file  Lifestyle  . Physical activity:    Days per week: Not on file    Minutes per session: Not on file  . Stress: Not on file  Relationships  . Social connections:    Talks on phone: Not on file    Gets together: Not on file    Attends religious service: Not on file    Active member of club or organization: Not on file    Attends meetings of clubs or organizations: Not on file    Relationship status: Not on file  . Intimate partner violence:    Fear of current or ex partner: Not on file    Emotionally abused: Not on file    Physically abused: Not on file    Forced sexual activity: Not on file  Other Topics Concern  . Not on file  Social History Narrative  . Not on file     Current Outpatient Medications on File Prior to Visit  Medication Sig Dispense Refill  .  ELIQUIS 5 MG TABS tablet Take 5 mg by mouth 2 (two) times daily.  3  . flecainide (TAMBOCOR) 50 MG tablet Take 1 tablet (50 mg total) by mouth 2 (two) times daily. 60 tablet 1  . lisinopril-hydrochlorothiazide (PRINZIDE,ZESTORETIC) 20-12.5 MG tablet Take 1 tablet by mouth daily.     . metoprolol tartrate (LOPRESSOR) 50 MG tablet Take 50 mg by mouth 2 (two) times daily.  1   No current facility-administered medications on file prior to visit.     Cardiovascular studies:  EKG 12/20/2018: Atrial fibrillation 88 bpm  Direct current cardioversion 10/25/2018: Indication symptomatic A. Fibrillation.  Procedure: Under deep sedation administered and monitored by  anesthesiology, synchronized direct current cardioversion performed. Patient was delivered with 120, 150, 200, 200 Joules of electricity X 4 with success to NSR. Patient tolerated the procedure well. No immediate complication noted.    Exercise Treadmill Stress Test 10/04/2018:  Indication: A-fib and SoB, on Flecainide  The patient exercised on Bruce protocol for 6:30 min. Patient achieved 7.05 METS and reached HR 160 bpm, which is 93% of maximum age-predicted HR. Stress test terminated due to Fatigue.  Exercise capacity was low normal. HR Response to Exercise: Appropriate. BP Response to Exercise: Normal resting BP- appropriate response. Chest Pain: none. Arrhythmias: atrial fibrillation. Resting EKG demonstrates atrial fibrillation. ST Changes: With peak exercise there was no ST-T changes of ischemia.  Overall Impression: Normal stress test. No medication related arrhymias seen. Recommendations: Continue primary/secondary prevention.  Exercise Treadmill Stress Test 07/26/2018: Indication: Atrial fibrilation The patient exercised on Bruce protocol for 07:19 min. Patient achieved 9.09 METS and reached HR 214 bpm, which is 124 % of maximum age-predicted HR. Stress test terminated due to fatigue.  Exercise capacity was normal. HR Response to Exercise: Exaggerated HR response. BP Response to Exercise: Normal resting BP- appropriate response. Chest Pain: none. Arrhythmias: atrial fibrillation. Resting EKG demonstrates atrial fibrilation. ST Changes: With peak exercise there was no ST-T changes of ischemia.  Overall Impression: Normal stress test. Recommendations: Continue primary/secondary prevention.  Echocardiogram 05/29/2018: Left ventricle cavity is normal in size. Mild asymmetric hypertrophy of the left ventricle. Normal global wall motion. Unable to evaluate diastolic function due to A. Fibrillation. Calculated EF 57%. Aortic root mildly dilated, measuring 4.0 cm in  AP dimension.  Left atrial cavity is moderately dilated. Inadequate tricuspid regurgitation jet to estimate pulmonary artery pressure. Normal right atrial pressure.   Review of Systems  Constitution: Negative for decreased appetite, malaise/fatigue, weight gain and weight loss.  HENT: Negative for congestion.   Eyes: Negative for visual disturbance.  Cardiovascular: Negative for chest pain, claudication, dyspnea on exertion, leg swelling, palpitations and syncope.  Respiratory: Negative for shortness of breath.   Endocrine: Negative for cold intolerance.  Hematologic/Lymphatic: Does not bruise/bleed easily.  Skin: Negative for itching and rash.  Musculoskeletal: Negative for myalgias.  Gastrointestinal: Negative for abdominal pain, nausea and vomiting.  Genitourinary: Negative for dysuria.  Neurological: Negative for dizziness and weakness.  Psychiatric/Behavioral: The patient is not nervous/anxious.   All other systems reviewed and are negative.      Objective:    Vitals:   12/20/18 1524  BP: (!) 143/84  Pulse: 79  SpO2: 94%     Physical Exam  Constitutional: He is oriented to person, place, and time. He appears well-developed and well-nourished. No distress.  Morbidly obese  HENT:  Head: Normocephalic and atraumatic.  Eyes: Pupils are equal, round, and reactive to light. Conjunctivae are normal.  Neck: No JVD present.  Cardiovascular: Normal rate, regular rhythm and intact distal pulses.  Pulmonary/Chest: Effort normal and breath sounds normal. He has no wheezes. He has no rales.  Abdominal: Soft. Bowel sounds are normal. There is no rebound.  Musculoskeletal:        General: No edema.  Lymphadenopathy:    He has no cervical adenopathy.  Neurological: He is alert and oriented to person, place, and time. No cranial nerve deficit.  Skin: Skin is warm and dry.  Psychiatric: He has a normal mood and affect.  Nursing note and vitals reviewed.       Assessment &  Recommendations:   48 year old African-American man with hypertension, obesity, obstructive sleep apnea noncompliant on CPAP, tobacco abuse, persistent atrial fibrillation.  Persistent atrial fibrillation: In spite of two cardioversion, one while on Flecainide 50 mg bid, he is back in sinus rhythm. While there are options to increase Flecainide dose, try other antiarrhythmics, or consider ablation, I think all efforts will be futile unless he loses weight and uses CPAP regularly. I had a long conversation with the patient regarding diet and lifestyle changes. I do not want to term this as permanent Afib yet, as I may attempt rhythm control therapy again in future, if loses weight.   Given that he remains in Afib, I have stopped his flecainide. Also, given his CHA2DS2-VASc Score of 1, his stroke risk is low. I have stopped his eliquis.  Hypertension: Continue lisinopril-HCTZ 20-12.5 mg daily, metoprolol 50 mg bid.   OSA: On CPAP  Morbid obesity: Discussed as above.  I wll see him back in 6 months.    Elder Negus, MD [facility] Pager: [phone] Office: [phone] If no answer Cell [phone]

## 2018-12-20 ENCOUNTER — Other Ambulatory Visit: Payer: Self-pay

## 2018-12-20 ENCOUNTER — Encounter: Payer: Self-pay | Admitting: Cardiology

## 2018-12-20 ENCOUNTER — Ambulatory Visit (INDEPENDENT_AMBULATORY_CARE_PROVIDER_SITE_OTHER): Payer: BLUE CROSS/BLUE SHIELD | Admitting: Cardiology

## 2018-12-20 VITALS — BP 143/84 | HR 79 | Ht 74.0 in | Wt 389.6 lb

## 2018-12-20 DIAGNOSIS — I4819 Other persistent atrial fibrillation: Secondary | ICD-10-CM | POA: Diagnosis not present

## 2018-12-20 DIAGNOSIS — G4733 Obstructive sleep apnea (adult) (pediatric): Secondary | ICD-10-CM

## 2018-12-20 DIAGNOSIS — I10 Essential (primary) hypertension: Secondary | ICD-10-CM | POA: Diagnosis not present

## 2018-12-20 DIAGNOSIS — Z6841 Body Mass Index (BMI) 40.0 and over, adult: Secondary | ICD-10-CM

## 2018-12-20 DIAGNOSIS — Z9989 Dependence on other enabling machines and devices: Secondary | ICD-10-CM

## 2018-12-20 NOTE — Patient Instructions (Signed)

## 2018-12-23 ENCOUNTER — Other Ambulatory Visit: Payer: Self-pay | Admitting: Cardiology

## 2018-12-23 NOTE — Telephone Encounter (Signed)
No. This was stopped at his 03/13 OV. Please do not fill.  Thanks MJP

## 2018-12-23 NOTE — Telephone Encounter (Signed)
Is this ok to send?  

## 2019-01-22 ENCOUNTER — Other Ambulatory Visit: Payer: Self-pay | Admitting: Cardiology

## 2019-01-22 DIAGNOSIS — I4891 Unspecified atrial fibrillation: Secondary | ICD-10-CM

## 2019-01-22 NOTE — Telephone Encounter (Signed)
Approving this order. Refusing the other duplicate order.

## 2019-01-22 NOTE — Telephone Encounter (Signed)
fill

## 2019-04-20 ENCOUNTER — Other Ambulatory Visit: Payer: Self-pay | Admitting: Cardiology

## 2019-04-20 DIAGNOSIS — I4891 Unspecified atrial fibrillation: Secondary | ICD-10-CM

## 2019-06-25 ENCOUNTER — Ambulatory Visit: Payer: BLUE CROSS/BLUE SHIELD | Admitting: Cardiology

## 2019-07-14 ENCOUNTER — Other Ambulatory Visit: Payer: Self-pay

## 2019-07-14 ENCOUNTER — Ambulatory Visit: Payer: BLUE CROSS/BLUE SHIELD | Admitting: Cardiology

## 2019-07-14 ENCOUNTER — Encounter: Payer: Self-pay | Admitting: Cardiology

## 2019-07-14 VITALS — BP 135/97 | HR 93 | Ht 73.0 in | Wt 383.0 lb

## 2019-07-14 DIAGNOSIS — I4811 Longstanding persistent atrial fibrillation: Secondary | ICD-10-CM | POA: Diagnosis not present

## 2019-07-14 DIAGNOSIS — Z9989 Dependence on other enabling machines and devices: Secondary | ICD-10-CM

## 2019-07-14 DIAGNOSIS — I10 Essential (primary) hypertension: Secondary | ICD-10-CM

## 2019-07-14 DIAGNOSIS — Z6841 Body Mass Index (BMI) 40.0 and over, adult: Secondary | ICD-10-CM

## 2019-07-14 DIAGNOSIS — G4733 Obstructive sleep apnea (adult) (pediatric): Secondary | ICD-10-CM | POA: Diagnosis not present

## 2019-07-14 MED ORDER — DILTIAZEM HCL ER COATED BEADS 240 MG PO CP24: 240.0000 mg | ORAL_CAPSULE | Freq: Every day | ORAL | 4 refills | Status: DC

## 2019-07-14 NOTE — Progress Notes (Signed)
Patient is here for follow up visit.  Subjective:   @Patient  ID: Jeffery Bowman, male    DOB: 1971-09-03, 48 y.o.   MRN: [id number]  Chief Complaint  Patient presents with  . Hypertension  . Atrial Fibrillation  . Follow-up     HPI  48 year old African-American man with hypertension, obesity, obstructive sleep apnea noncompliant on CPAP, tobacco abuse, persistent atrial fibrillation.  Patient had two successful cardioversions in 07/2018 and 10/2018, but was unable to maintain sinus rhythm at subsequent office visit. He remains in Afib. He has not lost any weight. He has had sexual side effects due to metoprolol.   Past Medical History:  Diagnosis Date  . Atrial fibrillation (HCC) 07/19/2018  . Essential hypertension 10/24/2018  . Hypertension   . Morbid obesity (HCC) 10/24/2018  . OSA (obstructive sleep apnea) 10/24/2018     Past Surgical History:  Procedure Laterality Date  . CARDIOVERSION N/A 07/19/2018   Procedure: CARDIOVERSION;  Surgeon: Yates DecampGanji, Jay, MD;  [facility];  Service: Cardiovascular;  Laterality: N/A;  . CARDIOVERSION N/A 10/25/2018   Procedure: CARDIOVERSION;  Surgeon: Elder NegusPatwardhan, Corinna Burkman J, MD;  Location: [facility] ENDOSCOPY;  Service: Cardiovascular;  Laterality: N/A;  . COLONOSCOPY WITH PROPOFOL N/A 05/11/2017   Procedure: COLONOSCOPY WITH PROPOFOL;  Surgeon: Jeani HawkingHung, Patrick, MD;  Location: [facility] ENDOSCOPY;  Service: Endoscopy;  Laterality: N/A;     Social History   Socioeconomic History  . Marital status: Divorced    Spouse name: Not on file  . Number of children: 1  . Years of education: Not on file  . Highest education level: Not on file  Occupational History  . Not on file  Social Needs  . Financial resource strain: Not on file  . Food insecurity    Worry: Not on file    Inability: Not on file  . Transportation needs    Medical: Not on file    Non-medical: Not on file  Tobacco Use  . Smoking status: Light Tobacco Smoker    Types:  Cigars    Last attempt to quit: 10/09/2018    Years since quitting: 0.7  . Smokeless tobacco: Never Used  Substance and Sexual Activity  . Alcohol use: Yes    Comment: Occasional  . Drug use: Never  . Sexual activity: Not on file  Lifestyle  . Physical activity    Days per week: Not on file    Minutes per session: Not on file  . Stress: Not on file  Relationships  . Social Musicianconnections    Talks on phone: Not on file    Gets together: Not on file    Attends religious service: Not on file    Active member of club or organization: Not on file    Attends meetings of clubs or organizations: Not on file    Relationship status: Not on file  . Intimate partner violence    Fear of current or ex partner: Not on file    Emotionally abused: Not on file    Physically abused: Not on file    Forced sexual activity: Not on file  Other Topics Concern  . Not on file  Social History Narrative  . Not on file     Current Outpatient Medications on File Prior to Visit  Medication Sig Dispense Refill  . lisinopril-hydrochlorothiazide (PRINZIDE,ZESTORETIC) 20-12.5 MG tablet Take 1 tablet by mouth daily.     . metoprolol tartrate (LOPRESSOR) 50 MG tablet TAKE 1 TABLET BY MOUTH TWICE A DAY (  1/19) 180 tablet 0   No current facility-administered medications on file prior to visit.     Cardiovascular studies:  EKG 07/14/2019: Atrial fibrillation with rapid ventricular response 118 bpm.  Low voltage in precordial leads.  Poor R-wave progression.  Direct current cardioversion 10/25/2018: Indication symptomatic A. Fibrillation.  Procedure: Under deep sedation administered and monitored by anesthesiology, synchronized direct current cardioversion performed. Patient was delivered with 120, 150, 200, 200 Joules of electricity X 4 with success to NSR. Patient tolerated the procedure well. No immediate complication noted.    Exercise Treadmill Stress Test 10/04/2018:  Indication: A-fib and SoB, on  Flecainide  The patient exercised on Bruce protocol for 6:30 min. Patient achieved 7.05 METS and reached HR 160 bpm, which is 93% of maximum age-predicted HR. Stress test terminated due to Fatigue.  Exercise capacity was low normal. HR Response to Exercise: Appropriate. BP Response to Exercise: Normal resting BP- appropriate response. Chest Pain: none. Arrhythmias: atrial fibrillation. Resting EKG demonstrates atrial fibrillation. ST Changes: With peak exercise there was no ST-T changes of ischemia.  Overall Impression: Normal stress test. No medication related arrhymias seen. Recommendations: Continue primary/secondary prevention.  Exercise Treadmill Stress Test 07/26/2018: Indication: Atrial fibrilation The patient exercised on Bruce protocol for 07:19 min. Patient achieved 9.09 METS and reached HR 214 bpm, which is 124 % of maximum age-predicted HR. Stress test terminated due to fatigue.  Exercise capacity was normal. HR Response to Exercise: Exaggerated HR response. BP Response to Exercise: Normal resting BP- appropriate response. Chest Pain: none. Arrhythmias: atrial fibrillation. Resting EKG demonstrates atrial fibrilation. ST Changes: With peak exercise there was no ST-T changes of ischemia.  Overall Impression: Normal stress test. Recommendations: Continue primary/secondary prevention.  Echocardiogram 05/29/2018: Left ventricle cavity is normal in size. Mild asymmetric hypertrophy of the left ventricle. Normal global wall motion. Unable to evaluate diastolic function due to A. Fibrillation. Calculated EF 57%. Aortic root mildly dilated, measuring 4.0 cm in AP dimension.  Left atrial cavity is moderately dilated. Inadequate tricuspid regurgitation jet to estimate pulmonary artery pressure. Normal right atrial pressure.   Review of Systems  Constitution: Negative for decreased appetite, malaise/fatigue, weight gain and weight loss.  HENT: Negative for  congestion.   Eyes: Negative for visual disturbance.  Cardiovascular: Negative for chest pain, claudication, dyspnea on exertion, leg swelling, palpitations and syncope.  Respiratory: Negative for shortness of breath.   Endocrine: Negative for cold intolerance.  Hematologic/Lymphatic: Does not bruise/bleed easily.  Skin: Negative for itching and rash.  Musculoskeletal: Negative for myalgias.  Gastrointestinal: Negative for abdominal pain, nausea and vomiting.  Genitourinary: Negative for dysuria.  Neurological: Negative for dizziness and weakness.  Psychiatric/Behavioral: The patient is not nervous/anxious.   All other systems reviewed and are negative.      Objective:    There were no vitals filed for this visit.   Physical Exam  Constitutional: He is oriented to person, place, and time. He appears well-developed and well-nourished. No distress.  Morbidly obese  HENT:  Head: Normocephalic and atraumatic.  Eyes: Pupils are equal, round, and reactive to light. Conjunctivae are normal.  Neck: No JVD present.  Cardiovascular: Normal rate, regular rhythm and intact distal pulses.  Pulmonary/Chest: Effort normal and breath sounds normal. He has no wheezes. He has no rales.  Abdominal: Soft. Bowel sounds are normal. There is no rebound.  Musculoskeletal:        General: No edema.  Lymphadenopathy:    He has no cervical adenopathy.  Neurological: He is alert  and oriented to person, place, and time. No cranial nerve deficit.  Skin: Skin is warm and dry.  Psychiatric: He has a normal mood and affect.  Nursing note and vitals reviewed.       Assessment & Recommendations:   48 year old African-American man with hypertension, obesity, obstructive sleep apnea noncompliant on CPAP, tobacco abuse, persistent atrial fibrillation.   Persistent atrial fibrillation: Failed cardioversion X2, failed flecainide 50 mg bid. He is not wanting to try other anti arrhythmic therapy options at  this time. Also reluctant to proceed with ablation. Weight loss and CPAP treatment remain crucial for management of his Afib. Given sexual side effect, switched metoprolol tartrate 50 mg bid to diltiazem 240 mg daily. CHA2DS2-VASc Score of 1, his stroke risk is low. Not on anticoagulation..  Hypertension: Continue lisinopril-HCTZ 20-12.5 mg daily, added diltiazem 240 mg daily-as above.  OSA: On CPAP  Morbid obesity: Discussed calorie negative diet, regular exercise. He does not want referral to weight loss clinic at this time.   I wll see him back in 3 months.     Elder Negus, MD [facility] [place] Pager: [phone] Office: [phone] If no answer Cell [phone]

## 2019-07-19 ENCOUNTER — Other Ambulatory Visit: Payer: Self-pay | Admitting: Cardiology

## 2019-07-19 DIAGNOSIS — I4891 Unspecified atrial fibrillation: Secondary | ICD-10-CM

## 2019-08-25 ENCOUNTER — Other Ambulatory Visit: Payer: Self-pay | Admitting: Cardiology

## 2019-08-25 DIAGNOSIS — I4891 Unspecified atrial fibrillation: Secondary | ICD-10-CM

## 2019-10-08 ENCOUNTER — Other Ambulatory Visit: Payer: Self-pay

## 2019-10-08 DIAGNOSIS — I10 Essential (primary) hypertension: Secondary | ICD-10-CM

## 2019-10-08 DIAGNOSIS — I4811 Longstanding persistent atrial fibrillation: Secondary | ICD-10-CM

## 2019-10-08 MED ORDER — DILTIAZEM HCL ER COATED BEADS 240 MG PO CP24: 240.0000 mg | ORAL_CAPSULE | Freq: Every day | ORAL | 1 refills | Status: DC

## 2019-10-16 ENCOUNTER — Ambulatory Visit: Payer: Self-pay | Admitting: Cardiology

## 2019-11-10 ENCOUNTER — Ambulatory Visit: Payer: BC Managed Care – PPO | Admitting: Cardiology

## 2019-11-10 ENCOUNTER — Other Ambulatory Visit: Payer: Self-pay

## 2019-11-10 ENCOUNTER — Other Ambulatory Visit (HOSPITAL_COMMUNITY): Payer: Self-pay | Admitting: Cardiology

## 2019-11-10 ENCOUNTER — Encounter: Payer: Self-pay | Admitting: Cardiology

## 2019-11-10 VITALS — BP 145/90 | HR 86 | Temp 97.1°F | Ht 73.0 in | Wt 378.0 lb

## 2019-11-10 DIAGNOSIS — I4811 Longstanding persistent atrial fibrillation: Secondary | ICD-10-CM

## 2019-11-10 DIAGNOSIS — Z9989 Dependence on other enabling machines and devices: Secondary | ICD-10-CM

## 2019-11-10 DIAGNOSIS — G4733 Obstructive sleep apnea (adult) (pediatric): Secondary | ICD-10-CM

## 2019-11-10 DIAGNOSIS — Z6841 Body Mass Index (BMI) 40.0 and over, adult: Secondary | ICD-10-CM

## 2019-11-10 DIAGNOSIS — Z13220 Encounter for screening for lipoid disorders: Secondary | ICD-10-CM | POA: Insufficient documentation

## 2019-11-10 DIAGNOSIS — I10 Essential (primary) hypertension: Secondary | ICD-10-CM

## 2019-11-10 MED ORDER — LISINOPRIL-HYDROCHLOROTHIAZIDE 20-12.5 MG PO TABS: 1.0000 | ORAL_TABLET | Freq: Every day | ORAL | 3 refills | Status: AC

## 2019-11-10 MED ORDER — DILTIAZEM HCL ER COATED BEADS 240 MG PO CP24: 240.0000 mg | ORAL_CAPSULE | Freq: Every day | ORAL | 3 refills | Status: DC

## 2019-11-10 NOTE — Progress Notes (Signed)
Patient is here for follow up visit.  Subjective:   @Patient  ID: , male    DOB: 09-21-1971, 49 y.o.   MRN: [id number]  Chief Complaint  Patient presents with  . Atrial Fibrillation  . Follow-up    3 month     HPI  49 year old African-American man with hypertension, obesity, obstructive sleep apnea noncompliant on CPAP, tobacco abuse, persistent atrial fibrillation.  Patient had two successful cardioversions in 07/2018 and 10/2018, but was unable to maintain sinus rhythm at subsequent office visit. He remains in Afib. He has not lost some weight, but unfortunately has hurt his knee while climbing mountains.    Current Outpatient Medications on File Prior to Visit  Medication Sig Dispense Refill  . diltiazem (CARDIZEM CD) 240 MG 24 hr capsule Take 1 capsule (240 mg total) by mouth daily. 90 capsule 1  . lisinopril-hydrochlorothiazide (PRINZIDE,ZESTORETIC) 20-12.5 MG tablet Take 1 tablet by mouth daily.      No current facility-administered medications on file prior to visit.    Cardiovascular studies:  EKG 07/14/2019: Atrial fibrillation with rapid ventricular response 118 bpm.  Low voltage in precordial leads.  Poor R-wave progression.  Direct current cardioversion 10/25/2018: Indication symptomatic A. Fibrillation.  Procedure: Under deep sedation administered and monitored by anesthesiology, synchronized direct current cardioversion performed. Patient was delivered with 120, 150, 200, 200 Joules of electricity X 4 with success to NSR. Patient tolerated the procedure well. No immediate complication noted.    Exercise Treadmill Stress Test 10/04/2018:  Indication: A-fib and SoB, on Flecainide  The patient exercised on Bruce protocol for 6:30 min. Patient achieved 7.05 METS and reached HR 160 bpm, which is 93% of maximum age-predicted HR. Stress test terminated due to Fatigue.  Exercise capacity was low normal. HR Response to Exercise:  Appropriate. BP Response to Exercise: Normal resting BP- appropriate response. Chest Pain: none. Arrhythmias: atrial fibrillation. Resting EKG demonstrates atrial fibrillation. ST Changes: With peak exercise there was no ST-T changes of ischemia.  Overall Impression: Normal stress test. No medication related arrhymias seen. Recommendations: Continue primary/secondary prevention.  Exercise Treadmill Stress Test 07/26/2018: Indication: Atrial fibrilation The patient exercised on Bruce protocol for 07:19 min. Patient achieved 9.09 METS and reached HR 214 bpm, which is 124 % of maximum age-predicted HR. Stress test terminated due to fatigue.  Exercise capacity was normal. HR Response to Exercise: Exaggerated HR response. BP Response to Exercise: Normal resting BP- appropriate response. Chest Pain: none. Arrhythmias: atrial fibrillation. Resting EKG demonstrates atrial fibrilation. ST Changes: With peak exercise there was no ST-T changes of ischemia.  Overall Impression: Normal stress test. Recommendations: Continue primary/secondary prevention.  Echocardiogram 05/29/2018: Left ventricle cavity is normal in size. Mild asymmetric hypertrophy of the left ventricle. Normal global wall motion. Unable to evaluate diastolic function due to A. Fibrillation. Calculated EF 57%. Aortic root mildly dilated, measuring 4.0 cm in AP dimension.  Left atrial cavity is moderately dilated. Inadequate tricuspid regurgitation jet to estimate pulmonary artery pressure. Normal right atrial pressure.   Review of Systems  Cardiovascular: Negative for chest pain, dyspnea on exertion, leg swelling, palpitations and syncope.  Musculoskeletal: Positive for joint pain (Left knee).       Objective:    Vitals:   11/10/19 1417 11/10/19 1421  BP: (!) 146/96 (!) 145/90  Pulse: 79 86  Temp: (!) 97.1 F (36.2 C)   SpO2: 98%      Physical Exam  Constitutional: He appears well-developed and  well-nourished.  Neck: No JVD  present.  Cardiovascular: Normal rate, normal heart sounds and intact distal pulses. An irregularly irregular rhythm present.  No murmur heard. Pulmonary/Chest: Effort normal and breath sounds normal. He has no wheezes. He has no rales.  Musculoskeletal:        General: No edema.  Nursing note and vitals reviewed.       Assessment & Recommendations:   49 year old African-American man with hypertension, obesity, obstructive sleep apnea noncompliant on CPAP, tobacco abuse, persistent atrial fibrillation.   Persistent atrial fibrillation: Failed cardioversion X2, failed flecainide 50 mg bid. He is not wanting to try other anti arrhythmic therapy options at this time. Also reluctant to proceed with ablation. Weight loss and CPAP treatment remain crucial for management of his Afib. Given sexual side effect, switched metoprolol tartrate 50 mg bid to diltiazem 240 mg daily. CHA2DS2-VASc Score of 1, his stroke risk is low. Not on anticoagulation..  Hypertension: Continue lisinopril-HCTZ 20-12.5 mg daily, added diltiazem 240 mg daily-as above. Will check BMP, lipid panel.   OSA: Emphasized importance of CPAP use.  Morbid obesity: Discussed calorie negative diet, regular exercise. He does not want referral to weight loss clinic at this time.   I wll see him back in 1 year.  Nigel Mormon, MD [facility] [place] Pager: [phone] Office: [phone] If no answer Cell [phone]

## 2019-11-11 LAB — LIPID PANEL
Chol/HDL Ratio: 3 ratio (ref 0.0–5.0)
Cholesterol, Total: 161 mg/dL (ref 100–199)
HDL: 53 mg/dL (ref >39)
LDL Chol Calc (NIH): 96 mg/dL (ref 0–99)
Triglycerides: 63 mg/dL (ref 0–149)
VLDL Cholesterol Cal: 12 mg/dL (ref 5–40)

## 2020-01-19 ENCOUNTER — Emergency Department (HOSPITAL_COMMUNITY): Payer: BC Managed Care – PPO

## 2020-01-19 ENCOUNTER — Emergency Department (HOSPITAL_COMMUNITY)
Admission: EM | Admit: 2020-01-19 | Discharge: 2020-01-19 | Disposition: A | Discharge status: Home / Self Care | Payer: BC Managed Care – PPO | Attending: Emergency Medicine | Admitting: Emergency Medicine

## 2020-01-19 ENCOUNTER — Other Ambulatory Visit: Payer: Self-pay

## 2020-01-19 ENCOUNTER — Encounter (HOSPITAL_COMMUNITY): Payer: Self-pay | Admitting: *Deleted

## 2020-01-19 DIAGNOSIS — Z720 Tobacco use: Secondary | ICD-10-CM | POA: Insufficient documentation

## 2020-01-19 DIAGNOSIS — U071 COVID-19: Secondary | ICD-10-CM | POA: Diagnosis not present

## 2020-01-19 DIAGNOSIS — R0602 Shortness of breath: Secondary | ICD-10-CM | POA: Diagnosis present

## 2020-01-19 DIAGNOSIS — I10 Essential (primary) hypertension: Secondary | ICD-10-CM | POA: Diagnosis not present

## 2020-01-19 DIAGNOSIS — Z79899 Other long term (current) drug therapy: Secondary | ICD-10-CM | POA: Insufficient documentation

## 2020-01-19 DIAGNOSIS — I482 Chronic atrial fibrillation, unspecified: Secondary | ICD-10-CM | POA: Diagnosis not present

## 2020-01-19 LAB — BASIC METABOLIC PANEL
Anion gap: 11 (ref 5–15)
BUN: 10 mg/dL (ref 6–20)
CO2: 25 mmol/L (ref 22–32)
Calcium: 8.4 mg/dL — ABNORMAL LOW (ref 8.9–10.3)
Chloride: 99 mmol/L (ref 98–111)
Creatinine, Ser: 1.14 mg/dL (ref 0.61–1.24)
GFR calc Af Amer: >60 mL/min (ref >60)
GFR calc non Af Amer: >60 mL/min (ref >60)
Glucose, Bld: 156 mg/dL — ABNORMAL HIGH (ref 70–99)
Potassium: 3.5 mmol/L (ref 3.5–5.1)
Sodium: 135 mmol/L (ref 135–145)

## 2020-01-19 LAB — CBC
HCT: 47 % (ref 39.0–52.0)
Hemoglobin: 14.8 g/dL (ref 13.0–17.0)
MCH: 26.4 pg (ref 26.0–34.0)
MCHC: 31.5 g/dL (ref 30.0–36.0)
MCV: 83.9 fL (ref 80.0–100.0)
Platelets: 189 10*3/uL (ref 150–400)
RBC: 5.6 MIL/uL (ref 4.22–5.81)
RDW: 12.8 % (ref 11.5–15.5)
WBC: 4.2 10*3/uL (ref 4.0–10.5)
nRBC: 0 % (ref 0.0–0.2)

## 2020-01-19 LAB — FERRITIN: Ferritin: 1440 ng/mL — ABNORMAL HIGH (ref 24–336)

## 2020-01-19 LAB — TRIGLYCERIDES: Triglycerides: 83 mg/dL (ref <150)

## 2020-01-19 LAB — C-REACTIVE PROTEIN: CRP: 7.6 mg/dL — ABNORMAL HIGH (ref <1.0)

## 2020-01-19 LAB — LACTATE DEHYDROGENASE: LDH: 257 U/L — ABNORMAL HIGH (ref 98–192)

## 2020-01-19 LAB — FIBRINOGEN: Fibrinogen: 437 mg/dL (ref 210–475)

## 2020-01-19 LAB — PROCALCITONIN: Procalcitonin: 0.15 ng/mL

## 2020-01-19 LAB — D-DIMER, QUANTITATIVE: D-Dimer, Quant: 0.68 ug/mL-FEU — ABNORMAL HIGH (ref 0.00–0.50)

## 2020-01-19 MED ORDER — DILTIAZEM HCL-DEXTROSE 125-5 MG/125ML-% IV SOLN (PREMIX)
5.0000 mg/h | INTRAVENOUS | Status: DC
  Administered 2020-01-19: 5 mg/h via INTRAVENOUS
  Filled 2020-01-19: qty 125

## 2020-01-19 MED ORDER — ALBUTEROL SULFATE HFA 108 (90 BASE) MCG/ACT IN AERS: 2.0000 | INHALATION_SPRAY | Freq: Once | RESPIRATORY_TRACT | Status: DC | PRN

## 2020-01-19 MED ORDER — DILTIAZEM HCL 60 MG PO TABS
60.0000 mg | ORAL_TABLET | Freq: Once | ORAL | Status: AC
  Administered 2020-01-19: 60 mg via ORAL
  Filled 2020-01-19: qty 1

## 2020-01-19 MED ORDER — SODIUM CHLORIDE 0.9 % IV SOLN
Freq: Once | INTRAVENOUS | Status: AC
  Filled 2020-01-19: qty 20

## 2020-01-19 MED ORDER — SODIUM CHLORIDE 0.9 % IV SOLN: INTRAVENOUS | Status: DC | PRN

## 2020-01-19 MED ORDER — DIPHENHYDRAMINE HCL 50 MG/ML IJ SOLN: 50.0000 mg | Freq: Once | INTRAMUSCULAR | Status: DC | PRN

## 2020-01-19 MED ORDER — EPINEPHRINE 0.3 MG/0.3ML IJ SOAJ: 0.3000 mg | Freq: Once | INTRAMUSCULAR | Status: DC | PRN

## 2020-01-19 MED ORDER — METHYLPREDNISOLONE SODIUM SUCC 125 MG IJ SOLR: 125.0000 mg | Freq: Once | INTRAMUSCULAR | Status: DC | PRN

## 2020-01-19 MED ORDER — ACETAMINOPHEN 500 MG PO TABS
1000.0000 mg | ORAL_TABLET | Freq: Once | ORAL | Status: AC
  Administered 2020-01-19: 1000 mg via ORAL
  Filled 2020-01-19: qty 2

## 2020-01-19 MED ORDER — DILTIAZEM LOAD VIA INFUSION
20.0000 mg | Freq: Once | INTRAVENOUS | Status: AC
  Administered 2020-01-19: 20 mg via INTRAVENOUS
  Filled 2020-01-19 (×2): qty 20

## 2020-01-19 MED ORDER — FAMOTIDINE IN NACL 20-0.9 MG/50ML-% IV SOLN: 20.0000 mg | Freq: Once | INTRAVENOUS | Status: DC | PRN

## 2020-01-19 NOTE — Discharge Instructions (Signed)
It was our pleasure to provide your ER care today - we hope that you feel better.  Drink adequate fluids, and make sure to take in adequate nutrition. Stay active, take full and deep breaths - make sure not to lie flat on back - spend as much time as possible in upright position, and/or spend 20-30 minutes at a time in prone (front of body down position) to help your breathing.   Continue your blood pressure medications. As relates your afib, contact your doctor tomorrow to discuss whether or not they want to start you on any blood thinner type of medication.   Return to ER right away if worse, increased trouble breathing, chest pain, weak/fainting, persistent fast heart beat, or other concern.

## 2020-01-19 NOTE — ED Notes (Signed)
Pharmacy messaged by this RN about cardizem infusion.

## 2020-01-19 NOTE — ED Triage Notes (Signed)
Pt reports being diagnosed with covid on Saturday. Reports ongoing sob and fatigue. Diaphoretic at triage.

## 2020-01-19 NOTE — ED Provider Notes (Signed)
[facility] EMERGENCY DEPARTMENT Provider Note   CSN: [id number] Arrival date & time: 01/19/20  1025     History Chief Complaint  Patient presents with  . Shortness of Breath  . Fatigue    Jeffery Bowman is a 49 y.o. male.  Patient with hx htn, afib, and recent diagnosis covid 2 days ago, presents with c/o persistent non prod cough, and sob. Symptoms acute onset 4 days ago, moderate, persistent, slowly feels worse. Symptoms include non-prod cough, subjective fever. No sore throat or runny nose. +body aches.  No headaches or neck pain/stiffness. No chest pain. No abd pain or nvd. No dysuria or gu c/o. Patient indicates his pcp told him to come to ED today.   The history is provided by the patient.  Shortness of Breath Associated symptoms: cough and fever   Associated symptoms: no abdominal pain, no chest pain, no headaches, no neck pain, no rash, no sore throat and no vomiting        Past Medical History:  Diagnosis Date  . Atrial fibrillation (HCC) 07/19/2018  . Essential hypertension 10/24/2018  . Hypertension   . Morbid obesity (HCC) 10/24/2018  . OSA (obstructive sleep apnea) 10/24/2018    Patient Active Problem List   Diagnosis Date Noted  . Screening cholesterol level 11/10/2019  . Morbid obesity with BMI of 50.0-59.9, adult (HCC) 12/20/2018  . Essential hypertension 10/24/2018  . Morbid obesity (HCC) 10/24/2018  . OSA on CPAP 10/24/2018  . Longstanding persistent atrial fibrillation (HCC) 07/19/2018    Past Surgical History:  Procedure Laterality Date  . CARDIOVERSION N/A 07/19/2018   Procedure: CARDIOVERSION;  Surgeon: Yates Decamp, MD;  Location: [facility] ENDOSCOPY;  Service: Cardiovascular;  Laterality: N/A;  . CARDIOVERSION N/A 10/25/2018   Procedure: CARDIOVERSION;  Surgeon: Elder Negus, MD;  Location: [facility] ENDOSCOPY;  Service: Cardiovascular;  Laterality: N/A;  . COLONOSCOPY WITH PROPOFOL N/A 05/11/2017   Procedure: COLONOSCOPY WITH  PROPOFOL;  Surgeon: Jeani Hawking, MD;  Location: [facility] ENDOSCOPY;  Service: Endoscopy;  Laterality: N/A;       Family History  Problem Relation Age of Onset  . Diabetes Mother   . Hypertension Mother   . Heart disease Mother   . Heart disease Father        Had heart transplant    Social History   Tobacco Use  . Smoking status: Light Tobacco Smoker    Types: Cigars    Last attempt to quit: 10/09/2018    Years since quitting: 1.2  . Smokeless tobacco: Never Used  Substance Use Topics  . Alcohol use: Yes    Comment: Occasional  . Drug use: Never    Home Medications Prior to Admission medications   Medication Sig Start Date End Date Taking? Authorizing Provider  diltiazem (CARDIZEM CD) 240 MG 24 hr capsule Take 1 capsule (240 mg total) by mouth daily. 11/10/19 02/08/20  Patwardhan, Anabel Bene, MD  lisinopril-hydrochlorothiazide (ZESTORETIC) 20-12.5 MG tablet Take 1 tablet by mouth daily. 11/10/19   Patwardhan, Anabel Bene, MD    Allergies    Patient has no known allergies.  Review of Systems   Review of Systems  Constitutional: Positive for fever.  HENT: Negative for sore throat.   Eyes: Negative for redness.  Respiratory: Positive for cough and shortness of breath.   Cardiovascular: Negative for chest pain.  Gastrointestinal: Negative for abdominal pain, diarrhea and vomiting.  Endocrine: Negative for polyuria.  Genitourinary: Negative for dysuria and flank pain.  Musculoskeletal: Negative for back  pain and neck pain.  Skin: Negative for rash.  Neurological: Negative for headaches.  Hematological: Does not bruise/bleed easily.  Psychiatric/Behavioral: Negative for confusion.    Physical Exam Updated Vital Signs BP (!) 110/57 (BP Location: Left Arm)   Pulse 86   Temp 99.2 F (37.3 C) (Oral)   Resp (!) 22   Ht 1.88 m (6\' 2" )   Wt (!) 167.8 kg   SpO2 99%   BMI 47.51 kg/m   Physical Exam Vitals and nursing note reviewed.  Constitutional:      Appearance: Normal  appearance. He is well-developed.  HENT:     Head: Atraumatic.     Nose: Nose normal.     Mouth/Throat:     Mouth: Mucous membranes are moist.     Pharynx: Oropharynx is clear.  Eyes:     General: No scleral icterus.    Conjunctiva/sclera: Conjunctivae normal.  Neck:     Trachea: No tracheal deviation.  Cardiovascular:     Rate and Rhythm: Normal rate and regular rhythm.     Pulses: Normal pulses.     Heart sounds: Normal heart sounds. No murmur. No friction rub. No gallop.   Pulmonary:     Effort: Pulmonary effort is normal. No accessory muscle usage or respiratory distress.     Breath sounds: Rhonchi present.  Abdominal:     General: Bowel sounds are normal. There is no distension.     Palpations: Abdomen is soft.     Tenderness: There is no abdominal tenderness. There is no guarding.  Genitourinary:    Comments: No cva tenderness. Musculoskeletal:        General: No swelling or tenderness.     Cervical back: Normal range of motion and neck supple. No rigidity.  Skin:    General: Skin is warm and dry.     Findings: No rash.  Neurological:     Mental Status: He is alert.     Comments: Alert, speech clear.   Psychiatric:        Mood and Affect: Mood normal.      ED Results / Procedures / Treatments   Labs (all labs ordered are listed, but only abnormal results are displayed) Results for orders placed or performed during the hospital encounter of 01/19/20  Basic metabolic panel  Result Value Ref Range   Sodium 135 135 - 145 mmol/L   Potassium 3.5 3.5 - 5.1 mmol/L   Chloride 99 98 - 111 mmol/L   CO2 25 22 - 32 mmol/L   Glucose, Bld 156 (H) 70 - 99 mg/dL   BUN 10 6 - 20 mg/dL   Creatinine, Ser 03/20/20 0.61 - 1.24 mg/dL   Calcium 8.4 (L) 8.9 - 10.3 mg/dL   GFR calc non Af Amer >60 >60 mL/min   GFR calc Af Amer >60 >60 mL/min   Anion gap 11 5 - 15  CBC  Result Value Ref Range   WBC 4.2 4.0 - 10.5 K/uL   RBC 5.60 4.22 - 5.81 MIL/uL   Hemoglobin 14.8 13.0 - 17.0  g/dL   HCT 1.88 41.6 - 60.6 %   MCV 83.9 80.0 - 100.0 fL   MCH 26.4 26.0 - 34.0 pg   MCHC 31.5 30.0 - 36.0 g/dL   RDW 30.1 60.1 - 09.3 %   Platelets 189 150 - 400 K/uL   nRBC 0.0 0.0 - 0.2 %   DG Chest 2 View  Result Date: 01/19/2020 CLINICAL DATA:  COVID positive, increased  shortness of breath EXAM: CHEST - 2 VIEW COMPARISON:  None. FINDINGS: Mild interstitial prominence with patchy increased density. No pleural effusion or pneumothorax. Cardiomediastinal contours are within normal limits. No significant osseous abnormality. IMPRESSION: Mild interstitial prominence and patchy increased density, which may reflect COVID-19 pneumonia. Electronically Signed   By: Guadlupe Spanish M.D.   On: 01/19/2020 12:34    EKG EKG Interpretation  Date/Time:  Monday January 19 2020 10:27:02 EDT Ventricular Rate:  129 PR Interval:    QRS Duration: 72 QT Interval:  290 QTC Calculation: 424 R Axis:   69 Text Interpretation: Atrial fibrillation with rapid ventricular response Low voltage QRS Confirmed by Cathren Laine ([id number]) on 01/19/2020 3:34:37 PM   Radiology DG Chest 2 View  Result Date: 01/19/2020 CLINICAL DATA:  COVID positive, increased shortness of breath EXAM: CHEST - 2 VIEW COMPARISON:  None. FINDINGS: Mild interstitial prominence with patchy increased density. No pleural effusion or pneumothorax. Cardiomediastinal contours are within normal limits. No significant osseous abnormality. IMPRESSION: Mild interstitial prominence and patchy increased density, which may reflect COVID-19 pneumonia. Electronically Signed   By: Guadlupe Spanish M.D.   On: 01/19/2020 12:34    Procedures Procedures (including critical care time)  Medications Ordered in ED Medications  diltiazem (CARDIZEM) 1 mg/mL load via infusion 20 mg (has no administration in time range)    And  diltiazem (CARDIZEM) 125 mg in dextrose 5% 125 mL (1 mg/mL) infusion (has no administration in time range)    ED Course  I have reviewed  the triage vital signs and the nursing notes.  Pertinent labs & imaging results that were available during my care of the patient were reviewed by me and considered in my medical decision making (see chart for details).    MDM Rules/Calculators/A&P                      Iv ns. Continuous pulse ox and monitor. Stat labs and cxr.   Reviewed nursing notes and prior charts for additional history. Hx afib, not on anticoag therapy.   Patient in afib w rvr. cardizem bolus/gtt. Pt indicates not on anticoag therapy.   CHA2DS2-VASc Score = 1 The patient's score is based upon: htn  Based on pts bmi, worsening symptoms, feeling dyspneic, feel pt is candidate for monoclonal ab infusion - ordered.   Labs reviewed/interpreted by me - chem normal.   CXR reviewed/interpreted by me - infiltrates c/w covid pna.   MDM Number of Diagnoses or Management Options   Amount and/or Complexity of Data Reviewed Clinical lab tests: ordered and reviewed Tests in the radiology section of CPT: ordered and reviewed Tests in the medicine section of CPT: ordered and reviewed Decide to obtain previous medical records or to obtain history from someone other than the patient: yes Obtain history from someone other than the patient: yes Review and summarize past medical records: yes Discuss the patient with other providers: yes  Risk of Complications, Morbidity, and/or Mortality Presenting problems: high Diagnostic procedures: high Management options: high   Braxtin Bamba was evaluated in Emergency Department on 01/19/2020 for the symptoms described in the history of present illness. He was evaluated in the context of the global COVID-19 pandemic, which necessitated consideration that the patient might be at risk for infection with the SARS-CoV-2 virus that causes COVID-19. Institutional protocols and algorithms that pertain to the evaluation of patients at risk for COVID-19 are in a state of rapid change based  on information released by regulatory  bodies including the CDC and federal and state organizations. These policies and algorithms were followed during the patient's care in the ED.   Recheck pt, breathing comfortably. No increased wob. Room air pulse ox 97%. No cp. No faintness.   Pt appears stable for d/c.   Rec close pcp/card f/u re afib. Pcp/remote health f/u re covid.   Return precautions provided.        Final Clinical Impression(s) / ED Diagnoses Final diagnoses:  [id number]    Rx / DC Orders ED Discharge Orders    None       Lajean Saver, MD 01/20/20 1321

## 2020-01-20 ENCOUNTER — Telehealth (HOSPITAL_COMMUNITY): Payer: Self-pay

## 2020-01-20 NOTE — Telephone Encounter (Signed)
Patient on ED report. Reached out to offer appointment. No answer left voicemail.

## 2020-04-25 ENCOUNTER — Encounter (HOSPITAL_COMMUNITY): Payer: Self-pay | Admitting: Emergency Medicine

## 2020-04-25 ENCOUNTER — Emergency Department (HOSPITAL_BASED_OUTPATIENT_CLINIC_OR_DEPARTMENT_OTHER): Payer: BC Managed Care – PPO

## 2020-04-25 ENCOUNTER — Other Ambulatory Visit: Payer: Self-pay

## 2020-04-25 ENCOUNTER — Emergency Department (HOSPITAL_COMMUNITY)
Admission: EM | Admit: 2020-04-25 | Discharge: 2020-04-25 | Disposition: A | Discharge status: Home / Self Care | Payer: BC Managed Care – PPO | Attending: Emergency Medicine | Admitting: Emergency Medicine

## 2020-04-25 DIAGNOSIS — S86811A Strain of other muscle(s) and tendon(s) at lower leg level, right leg, initial encounter: Secondary | ICD-10-CM

## 2020-04-25 DIAGNOSIS — R2241 Localized swelling, mass and lump, right lower limb: Secondary | ICD-10-CM | POA: Insufficient documentation

## 2020-04-25 DIAGNOSIS — Y9353 Activity, golf: Secondary | ICD-10-CM | POA: Insufficient documentation

## 2020-04-25 DIAGNOSIS — S86911A Strain of unspecified muscle(s) and tendon(s) at lower leg level, right leg, initial encounter: Secondary | ICD-10-CM | POA: Insufficient documentation

## 2020-04-25 DIAGNOSIS — X509XXA Other and unspecified overexertion or strenuous movements or postures, initial encounter: Secondary | ICD-10-CM | POA: Diagnosis not present

## 2020-04-25 DIAGNOSIS — F17290 Nicotine dependence, other tobacco product, uncomplicated: Secondary | ICD-10-CM | POA: Insufficient documentation

## 2020-04-25 DIAGNOSIS — Z79899 Other long term (current) drug therapy: Secondary | ICD-10-CM | POA: Diagnosis not present

## 2020-04-25 DIAGNOSIS — Y9289 Other specified places as the place of occurrence of the external cause: Secondary | ICD-10-CM | POA: Insufficient documentation

## 2020-04-25 DIAGNOSIS — Y999 Unspecified external cause status: Secondary | ICD-10-CM | POA: Insufficient documentation

## 2020-04-25 DIAGNOSIS — I10 Essential (primary) hypertension: Secondary | ICD-10-CM | POA: Insufficient documentation

## 2020-04-25 DIAGNOSIS — Y9301 Activity, walking, marching and hiking: Secondary | ICD-10-CM | POA: Insufficient documentation

## 2020-04-25 DIAGNOSIS — S8991XA Unspecified injury of right lower leg, initial encounter: Secondary | ICD-10-CM | POA: Diagnosis present

## 2020-04-25 MED ORDER — METHOCARBAMOL 750 MG PO TABS
750.0000 mg | ORAL_TABLET | Freq: Three times a day (TID) | ORAL | 0 refills | Status: DC | PRN
Start: 2020-04-25 — End: 2020-12-15

## 2020-04-25 MED ORDER — ACETAMINOPHEN 500 MG PO TABS: 1000.0000 mg | ORAL_TABLET | Freq: Four times a day (QID) | ORAL | 0 refills | Status: AC | PRN

## 2020-04-25 NOTE — Discharge Instructions (Signed)
1.  Rest and elevate your leg for the next 2 days.  Apply ice packs as instructed.  Wear your compression sock when up and walking.  Avoid any forceful activities such as running, stair climbing or anything else that involves pushing off of your foot or toe.

## 2020-04-25 NOTE — ED Provider Notes (Signed)
[facility] EMERGENCY DEPARTMENT Provider Note   CSN: [id number] Arrival date & time: 04/25/20  1124     History Chief Complaint  Patient presents with  . Leg Pain    Jeffery Bowman is a 49 y.o. male.  HPI Patient was playing golf yesterday.  He was walking up a slight hill and felt a pop in the back of his right calf.  He reports it is more tight and uncomfortable today.  He can walk on it but he is getting a sharp pain in the mid to upper portion of the calf.  No numbness or tingling into the foot.  He has been trying some ice elevation and elevating but continues to have pain and swelling.    Past Medical History:  Diagnosis Date  . Atrial fibrillation (HCC) 07/19/2018  . Essential hypertension 10/24/2018  . Hypertension   . Morbid obesity (HCC) 10/24/2018  . OSA (obstructive sleep apnea) 10/24/2018    Patient Active Problem List   Diagnosis Date Noted  . Screening cholesterol level 11/10/2019  . Morbid obesity with BMI of 50.0-59.9, adult (HCC) 12/20/2018  . Essential hypertension 10/24/2018  . Morbid obesity (HCC) 10/24/2018  . OSA on CPAP 10/24/2018  . Longstanding persistent atrial fibrillation (HCC) 07/19/2018    Past Surgical History:  Procedure Laterality Date  . CARDIOVERSION N/A 07/19/2018   Procedure: CARDIOVERSION;  Surgeon: Yates Decamp, MD;  Location: [facility] ENDOSCOPY;  Service: Cardiovascular;  Laterality: N/A;  . CARDIOVERSION N/A 10/25/2018   Procedure: CARDIOVERSION;  Surgeon: Elder Negus, MD;  Location: [facility] ENDOSCOPY;  Service: Cardiovascular;  Laterality: N/A;  . COLONOSCOPY WITH PROPOFOL N/A 05/11/2017   Procedure: COLONOSCOPY WITH PROPOFOL;  Surgeon: Jeani Hawking, MD;  Location: [facility] ENDOSCOPY;  Service: Endoscopy;  Laterality: N/A;       Family History  Problem Relation Age of Onset  . Diabetes Mother   . Hypertension Mother   . Heart disease Mother   . Heart disease Father        Had heart transplant    Social  History   Tobacco Use  . Smoking status: Light Tobacco Smoker    Types: Cigars    Last attempt to quit: 10/09/2018    Years since quitting: 1.5  . Smokeless tobacco: Never Used  Vaping Use  . Vaping Use: Never used  Substance Use Topics  . Alcohol use: Yes    Comment: Occasional  . Drug use: Never    Home Medications Prior to Admission medications   Medication Sig Start Date End Date Taking? Authorizing Provider  acetaminophen (TYLENOL) 500 MG tablet Take 2 tablets (1,000 mg total) by mouth every 6 (six) hours as needed for moderate pain. 04/25/20   Arby Barrette, MD  diltiazem (CARDIZEM CD) 240 MG 24 hr capsule Take 1 capsule (240 mg total) by mouth daily. 11/10/19 02/08/20  Patwardhan, Anabel Bene, MD  lisinopril-hydrochlorothiazide (ZESTORETIC) 20-12.5 MG tablet Take 1 tablet by mouth daily. 11/10/19   Patwardhan, Anabel Bene, MD  methocarbamol (ROBAXIN-750) 750 MG tablet Take 1 tablet (750 mg total) by mouth every 8 (eight) hours as needed for muscle spasms. 04/25/20   Arby Barrette, MD    Allergies    Patient has no known allergies.  Review of Systems   Review of Systems Constitutional: No fever no chills no malaise Neurologic: No weakness numbness or tingling Physical Exam Updated Vital Signs BP (!) 134/93   Pulse (!) 106   Temp 98.4 F (36.9 C) (Oral)  Resp 16   SpO2 99%   Physical Exam Constitutional:      Comments: Alert nontoxic clinically well in appearance  Pulmonary:     Effort: Pulmonary effort is normal.  Musculoskeletal:     Comments: Right calf mildly full as compared to the left on exam.  Tenderness to deep palpation in the upper medial and mid aspect of the right gastrocnemius.  No effusion at the knee.  No erythema.  No pain to compression of the Achilles or lower soleus area.  Swelling of the foot on the right.  Feet are symmetric right to left.  Feet warm and dry with good cap refill.  No sensory deficit to light touch.  Patient is able to stand and bear  weight symmetrically.  Skin:    General: Skin is warm and dry.  Neurological:     General: No focal deficit present.     Mental Status: He is oriented to person, place, and time.     Coordination: Coordination normal.  Psychiatric:        Mood and Affect: Mood normal.     ED Results / Procedures / Treatments   Labs (all labs ordered are listed, but only abnormal results are displayed) Labs Reviewed - No data to display  EKG None  Radiology VAS [place] LOWER EXTREMITY VENOUS (DVT) (ONLY [facility])  Result Date: 04/25/2020  Lower Venous DVTStudy Indications: Patient felt "pop" in calf while playing golf.  Limitations: Body habitus. Comparison Study: No prior study on file Performing Technologist: Sherren Kerns RVS  Examination Guidelines: A complete evaluation includes B-mode imaging, spectral Doppler, color Doppler, and power Doppler as needed of all accessible portions of each vessel. Bilateral testing is considered an integral part of a complete examination. Limited examinations for reoccurring indications may be performed as noted. The reflux portion of the exam is performed with the patient in reverse Trendelenburg.  +---------+---------------+---------+-----------+----------+--------------+ RIGHT    CompressibilityPhasicitySpontaneityPropertiesThrombus Aging +---------+---------------+---------+-----------+----------+--------------+ CFV      Full           Yes      Yes                                 +---------+---------------+---------+-----------+----------+--------------+ SFJ      Full                                                        +---------+---------------+---------+-----------+----------+--------------+ FV Prox  Full                                                        +---------+---------------+---------+-----------+----------+--------------+ FV Mid   Full                                                         +---------+---------------+---------+-----------+----------+--------------+ FV DistalFull                                                        +---------+---------------+---------+-----------+----------+--------------+  PFV      Full                                                        +---------+---------------+---------+-----------+----------+--------------+ POP      Full           Yes      Yes                                 +---------+---------------+---------+-----------+----------+--------------+ PTV      Full                                                        +---------+---------------+---------+-----------+----------+--------------+ PERO     Full                                                        +---------+---------------+---------+-----------+----------+--------------+   Left Technical Findings: Left leg not evaluated.   Summary: RIGHT: - There is no evidence of deep vein thrombosis in the lower extremity.  - No cystic structure found in the popliteal fossa.   *See table(s) above for measurements and observations. Electronically signed by Fabienne Bruns MD on 04/25/2020 at 12:59:17 PM.    Final     Procedures Procedures (including critical care time)  Medications Ordered in ED Medications - No data to display  ED Course  I have reviewed the triage vital signs and the nursing notes.  Pertinent labs & imaging results that were available during my care of the patient were reviewed by me and considered in my medical decision making (see chart for details).    MDM Rules/Calculators/A&P                          Patient presents with findings suggestive of partial gastrocnemius tear.  Patient has intact strength for flexion and extension.  No findings to suggest an Achilles rupture.  No pain to compression or dysfunction over the soleus or Achilles tendon.  Knee without effusion.  Have counseled on conservative measures of icing elevating limited  weightbearing for the next 2 days.  Patient will wear compression device on lower leg.  Acetaminophen and Robaxin prescribed.  Acetaminophen given preferentially over NSAID due to patient's history of hypertension.  Patient is counseled on possibility of discoloration from bruising down the leg or foot in several days as injury is resolving. Final Clinical Impression(s) / ED Diagnoses Final diagnoses:  Strain of calf muscle, right, initial encounter    Rx / DC Orders ED Discharge Orders         Ordered    acetaminophen (TYLENOL) 500 MG tablet  Every 6 hours PRN     Discontinue  Reprint     04/25/20 1623    methocarbamol (ROBAXIN-750) 750 MG tablet  Every 8 hours PRN     Discontinue  Reprint     04/25/20 1623  Arby BarrettePfeiffer, Bryttney Netzer, MD 04/25/20 [phone]

## 2020-04-25 NOTE — ED Triage Notes (Signed)
Pt. Stated, I was playing golf yesterday and felt something pop in my rt.lower leg.

## 2020-04-25 NOTE — Progress Notes (Signed)
VASCULAR LAB PRELIMINARY  PRELIMINARY  PRELIMINARY  PRELIMINARY  Right lower extremity venous duplex completed.    Preliminary report:  See CV proc for preliminary results.  Called report to [place], RN  Elhadj Girton, RVT 04/25/2020, 12:12 PM

## 2020-11-11 ENCOUNTER — Ambulatory Visit: Payer: BC Managed Care – PPO | Admitting: Cardiology

## 2020-11-17 ENCOUNTER — Ambulatory Visit: Payer: BC Managed Care – PPO | Admitting: Cardiology

## 2020-12-04 ENCOUNTER — Other Ambulatory Visit: Payer: Self-pay | Admitting: Cardiology

## 2020-12-04 DIAGNOSIS — I10 Essential (primary) hypertension: Secondary | ICD-10-CM

## 2020-12-04 DIAGNOSIS — I4811 Longstanding persistent atrial fibrillation: Secondary | ICD-10-CM

## 2020-12-15 ENCOUNTER — Ambulatory Visit: Payer: BC Managed Care – PPO | Admitting: Cardiology

## 2020-12-15 ENCOUNTER — Other Ambulatory Visit: Payer: Self-pay

## 2020-12-15 ENCOUNTER — Inpatient Hospital Stay: Payer: BC Managed Care – PPO

## 2020-12-15 ENCOUNTER — Encounter: Payer: Self-pay | Admitting: Cardiology

## 2020-12-15 VITALS — BP 133/85 | HR 86 | Temp 97.8°F | Resp 16 | Ht 74.0 in | Wt 390.0 lb

## 2020-12-15 DIAGNOSIS — I10 Essential (primary) hypertension: Secondary | ICD-10-CM

## 2020-12-15 DIAGNOSIS — R0600 Dyspnea, unspecified: Secondary | ICD-10-CM | POA: Insufficient documentation

## 2020-12-15 DIAGNOSIS — I4811 Longstanding persistent atrial fibrillation: Secondary | ICD-10-CM

## 2020-12-15 DIAGNOSIS — R0609 Other forms of dyspnea: Secondary | ICD-10-CM | POA: Insufficient documentation

## 2020-12-15 DIAGNOSIS — Z9989 Dependence on other enabling machines and devices: Secondary | ICD-10-CM

## 2020-12-15 DIAGNOSIS — Z6841 Body Mass Index (BMI) 40.0 and over, adult: Secondary | ICD-10-CM

## 2020-12-15 DIAGNOSIS — G4733 Obstructive sleep apnea (adult) (pediatric): Secondary | ICD-10-CM

## 2020-12-15 NOTE — Progress Notes (Signed)
Patient is here for follow up visit.  Subjective:   _0  ID: Jeffery Bowman, male    DOB: 1971/01/30, 50 y.o.   MRN: [id number]   Chief Complaint  Patient presents with  . Atrial Fibrillation  . Follow-up    1 year    HPI  50 year old African-American man with hypertension, obesity, obstructive sleep apnea noncompliant on CPAP, tobacco abuse, persistent atrial fibrillation.  Patient had two successful cardioversions in 07/2018 and 10/2018, but was unable to maintain sinus rhythm at subsequent office visit. He remains in Afib. He had COVID in 01/2020 and has had some jpint pains, as well as exertional dyspnea since then.   Current Outpatient Medications on File Prior to Visit  Medication Sig Dispense Refill  . acetaminophen (TYLENOL) 500 MG tablet Take 2 tablets (1,000 mg total) by mouth every 6 (six) hours as needed for moderate pain. 30 tablet 0  . diltiazem (CARDIZEM CD) 240 MG 24 hr capsule TAKE 1 CAPSULE BY MOUTH EVERY DAY 30 capsule 0  . lisinopril-hydrochlorothiazide (ZESTORETIC) 20-12.5 MG tablet Take 1 tablet by mouth daily. 90 tablet 3  . methocarbamol (ROBAXIN-750) 750 MG tablet Take 1 tablet (750 mg total) by mouth every 8 (eight) hours as needed for muscle spasms. 20 tablet 0   No current facility-administered medications on file prior to visit.    Cardiovascular studies:  EKG 12/15/2020: Atrial fibrillation 108 bpm Low voltage in precordial leads.  Voltage criteria for LVH  (R(I)+S(III) exceeds 2.50 mV)  -Voltage criteria w/o ST/T abnormality may be normal.  Poor R-wave progression -may be secondary to pulmonary disease   consider old anterior infarct.    Direct current cardioversion 10/25/2018: Indication symptomatic A. Fibrillation.  Procedure: Under deep sedation administered and monitored by anesthesiology, synchronized direct current cardioversion performed. Patient was delivered with 120, 150, 200, 200 Joules of electricity X 4 with success to  NSR. Patient tolerated the procedure well. No immediate complication noted.   Exercise Treadmill Stress Test 10/04/2018:  Indication: A-fib and SoB, on Flecainide  The patient exercised on Bruce protocol for 6:30 min. Patient achieved 7.05 METS and reached HR 160 bpm, which is 93% of maximum age-predicted HR. Stress test terminated due to Fatigue.  Exercise capacity was low normal. HR Response to Exercise: Appropriate. BP Response to Exercise: Normal resting BP- appropriate response. Chest Pain: none. Arrhythmias: atrial fibrillation. Resting EKG demonstrates atrial fibrillation. ST Changes: With peak exercise there was no ST-T changes of ischemia.  Overall Impression: Normal stress test. No medication related arrhymias seen. Recommendations: Continue primary/secondary prevention.  Echocardiogram 05/29/2018: Left ventricle cavity is normal in size. Mild asymmetric hypertrophy of the left ventricle. Normal global wall motion. Unable to evaluate diastolic function due to A. Fibrillation. Calculated EF 57%. Aortic root mildly dilated, measuring 4.0 cm in AP dimension.  Left atrial cavity is moderately dilated. Inadequate tricuspid regurgitation jet to estimate pulmonary artery pressure. Normal right atrial pressure.  Recent labs: 01/19/2020: Glucose 156, BUN/Cr 10/1.14. EGFR >60. Na/K 135/3.5.  Chol 161, TG 63, HDL 53, LDL 96   Review of Systems  Cardiovascular: Negative for chest pain, dyspnea on exertion, leg swelling, palpitations and syncope.  Musculoskeletal: Positive for joint pain (Left knee).       Objective:    Vitals:   12/15/20 1437  BP: 133/85  Pulse: 86  Resp: 16  Temp: 97.8 F (36.6 C)  SpO2: 97%     Physical Exam Vitals and nursing note reviewed.  Constitutional:  Appearance: He is well-developed.  Neck:     Vascular: No JVD.  Cardiovascular:     Rate and Rhythm: Normal rate. Rhythm irregularly irregular.     Pulses: Intact distal pulses.      Heart sounds: Normal heart sounds. No murmur heard.   Pulmonary:     Effort: Pulmonary effort is normal.     Breath sounds: Normal breath sounds. No wheezing or rales.         Assessment & Recommendations:   50 year old African-American man with hypertension, obesity, obstructive sleep apnea noncompliant on CPAP, tobacco abuse, persistent atrial fibrillation.  Exertional dyspnea: May be long COVID. Will check echocardiogram to r/o cardiac etiology.   Persistent atrial fibrillation: Failed cardioversion X2, failed flecainide 50 mg bid. He is not wanting to try other anti arrhythmic therapy options at this time. Also reluctant to proceed with ablation. Weight loss and CPAP treatment remain crucial for management of his Afib. Given sexual side effect, switched metoprolol tartrate 50 mg bid to diltiazem 240 mg daily. CHA2DS2-VASc Score of 1, his stroke risk is low. Not on anticoagulation.. Will check cardiac telemetry to assess rate control  Hypertension: Continue lisinopril-HCTZ 20-12.5 mg daily, added diltiazem 240 mg daily-as above.  OSA: Emphasized importance of CPAP use.  Morbid obesity: Re-emphasized importance of weight loss  F/u in 4-6 weeks  [place], MD [facility] Pager: [phone] Office: [phone] If no answer Cell [phone]

## 2020-12-29 ENCOUNTER — Ambulatory Visit: Payer: BC Managed Care – PPO

## 2020-12-29 ENCOUNTER — Other Ambulatory Visit: Payer: Self-pay

## 2020-12-29 DIAGNOSIS — I4811 Longstanding persistent atrial fibrillation: Secondary | ICD-10-CM

## 2020-12-29 DIAGNOSIS — R0600 Dyspnea, unspecified: Secondary | ICD-10-CM

## 2020-12-29 DIAGNOSIS — R0609 Other forms of dyspnea: Secondary | ICD-10-CM

## 2021-01-05 ENCOUNTER — Other Ambulatory Visit: Payer: Self-pay | Admitting: Cardiology

## 2021-01-05 DIAGNOSIS — I10 Essential (primary) hypertension: Secondary | ICD-10-CM

## 2021-01-05 DIAGNOSIS — I4811 Longstanding persistent atrial fibrillation: Secondary | ICD-10-CM

## 2021-01-13 ENCOUNTER — Ambulatory Visit: Payer: BC Managed Care – PPO | Admitting: Cardiology

## 2021-01-13 ENCOUNTER — Other Ambulatory Visit: Payer: Self-pay

## 2021-01-13 ENCOUNTER — Encounter: Payer: Self-pay | Admitting: Cardiology

## 2021-01-13 VITALS — BP 123/94 | HR 94 | Temp 98.0°F | Resp 16 | Ht 74.0 in | Wt 390.0 lb

## 2021-01-13 DIAGNOSIS — Z9989 Dependence on other enabling machines and devices: Secondary | ICD-10-CM

## 2021-01-13 DIAGNOSIS — I4811 Longstanding persistent atrial fibrillation: Secondary | ICD-10-CM

## 2021-01-13 DIAGNOSIS — I10 Essential (primary) hypertension: Secondary | ICD-10-CM

## 2021-01-13 NOTE — Progress Notes (Signed)
Patient is here for follow up visit.  Subjective:   _0  ID: Jeffery Bowman, male    DOB: 1971-10-01, 50 y.o.   MRN: [id number]   Chief Complaint  Patient presents with  . Atrial Fibrillation  . Shortness of Breath  . Follow-up    4 week    HPI  50 year old African-American man with hypertension, obesity, obstructive sleep apnea noncompliant on CPAP, tobacco abuse, persistent atrial fibrillation.  Patient had two successful cardioversions in 07/2018 and 10/2018, but was unable to maintain sinus rhythm at subsequent office visit. He remains in Afib. He had COVID in 01/2020 and has had some joint pains, as well as exertional dyspnea since then. Reviewed recent monitor results with the patient, details below.   Current Outpatient Medications on File Prior to Visit  Medication Sig Dispense Refill  . acetaminophen (TYLENOL) 500 MG tablet Take 2 tablets (1,000 mg total) by mouth every 6 (six) hours as needed for moderate pain. 30 tablet 0  . diltiazem (CARDIZEM CD) 240 MG 24 hr capsule TAKE 1 CAPSULE BY MOUTH EVERY DAY 30 capsule 0  . lisinopril-hydrochlorothiazide (ZESTORETIC) 20-12.5 MG tablet Take 1 tablet by mouth daily. 90 tablet 3   No current facility-administered medications on file prior to visit.    Cardiovascular studies:  Echocardiogram 12/29/2020:  Normal LV systolic function with visual EF 60-65%. Left ventricle cavity  is normal in size. Mild left ventricular hypertrophy. Normal global wall  motion. Unable to evaluate diastolic function due to atrial fibrillation.  Normal LAP.  No significant valvular abnormalities.  Compared to prior study dated 05/29/2018 no significant change.   Mobile cardiac telemetry 6 days 12/15/2020 - 12/22/2020: Dominant rhythm: Atrial fibrillation (100% Afib burden) HR 57-169 bpm. Avg HR 101 bpm. <1% isolated VE, couplet/triplets. No atrial flutter/SVT/VT/high grade AV block, sinus pause >3sec noted. 8 patient triggered events,  correlate with Afib, no other arrhtymias   EKG 12/15/2020: Atrial fibrillation 108 bpm Low voltage in precordial leads.  Voltage criteria for LVH  (R(I)+S(III) exceeds 2.50 mV)  -Voltage criteria w/o ST/T abnormality may be normal.  Poor R-wave progression -may be secondary to pulmonary disease   consider old anterior infarct.    Direct current cardioversion 10/25/2018: Indication symptomatic A. Fibrillation.  Procedure: Under deep sedation administered and monitored by anesthesiology, synchronized direct current cardioversion performed. Patient was delivered with 120, 150, 200, 200 Joules of electricity X 4 with success to NSR. Patient tolerated the procedure well. No immediate complication noted.   Exercise Treadmill Stress Test 10/04/2018:  Indication: A-fib and SoB, on Flecainide  The patient exercised on Bruce protocol for 6:30 min. Patient achieved 7.05 METS and reached HR 160 bpm, which is 93% of maximum age-predicted HR. Stress test terminated due to Fatigue.  Exercise capacity was low normal. HR Response to Exercise: Appropriate. BP Response to Exercise: Normal resting BP- appropriate response. Chest Pain: none. Arrhythmias: atrial fibrillation. Resting EKG demonstrates atrial fibrillation. ST Changes: With peak exercise there was no ST-T changes of ischemia.  Overall Impression: Normal stress test. No medication related arrhymias seen. Recommendations: Continue primary/secondary prevention.  Recent labs: 01/19/2020: Glucose 156, BUN/Cr 10/1.14. EGFR >60. Na/K 135/3.5.  Chol 161, TG 63, HDL 53, LDL 96   Review of Systems  Cardiovascular: Negative for chest pain, dyspnea on exertion, leg swelling, palpitations and syncope.  Musculoskeletal: Positive for joint pain (Left knee).       Objective:    Vitals:   01/13/21 1500 01/13/21 1502  BP: (!) 135/98 Marland Kitchen)  123/94  Pulse: 91 94  Resp: 16   Temp: 98 F (36.7 C)   SpO2: 96%      Physical Exam Vitals and  nursing note reviewed.  Constitutional:      Appearance: He is well-developed.  Neck:     Vascular: No JVD.  Cardiovascular:     Rate and Rhythm: Normal rate. Rhythm irregularly irregular.     Pulses: Intact distal pulses.     Heart sounds: Normal heart sounds. No murmur heard.   Pulmonary:     Effort: Pulmonary effort is normal.     Breath sounds: Normal breath sounds. No wheezing or rales.         Assessment & Recommendations:   50 year old African-American man with hypertension, obesity, obstructive sleep apnea noncompliant on CPAP, tobacco abuse, persistent atrial fibrillation.  Exertional dyspnea: May be long COVID. Will check echocardiogram to r/o cardiac etiology.   Persistent atrial fibrillation: Failed cardioversion X2, failed flecainide 50 mg bid. He is not wanting to try other anti arrhythmic therapy options at this time. Also reluctant to proceed with ablation. Weight loss and CPAP treatment remain crucial for management of his Afib. Continue diltiazem 240 mg daily. CHA2DS2-VASc Score of 1, his stroke risk is low. Not on anticoagulation.. Cardiac monitor showed avg HR 101 bpm (12/2020)  Hypertension: Continue lisinopril-HCTZ 20-12.5 mg daily, diltiazem 240 mg daily-as above.  OSA: Emphasized importance of CPAP use.  Morbid obesity: Re-emphasized importance of weight loss  F/u in 1 year  Nigel Mormon, MD [facility] Pager: [phone] Office: [phone] If no answer Cell [phone]

## 2021-01-23 IMAGING — DX DG CHEST 2V
2 series · 2 of 2 positions shown · non-contrast
Comparison: None.

CLINICAL DATA: COVID positive, increased shortness of breath

EXAM:
CHEST - 2 VIEW

[chest pa]
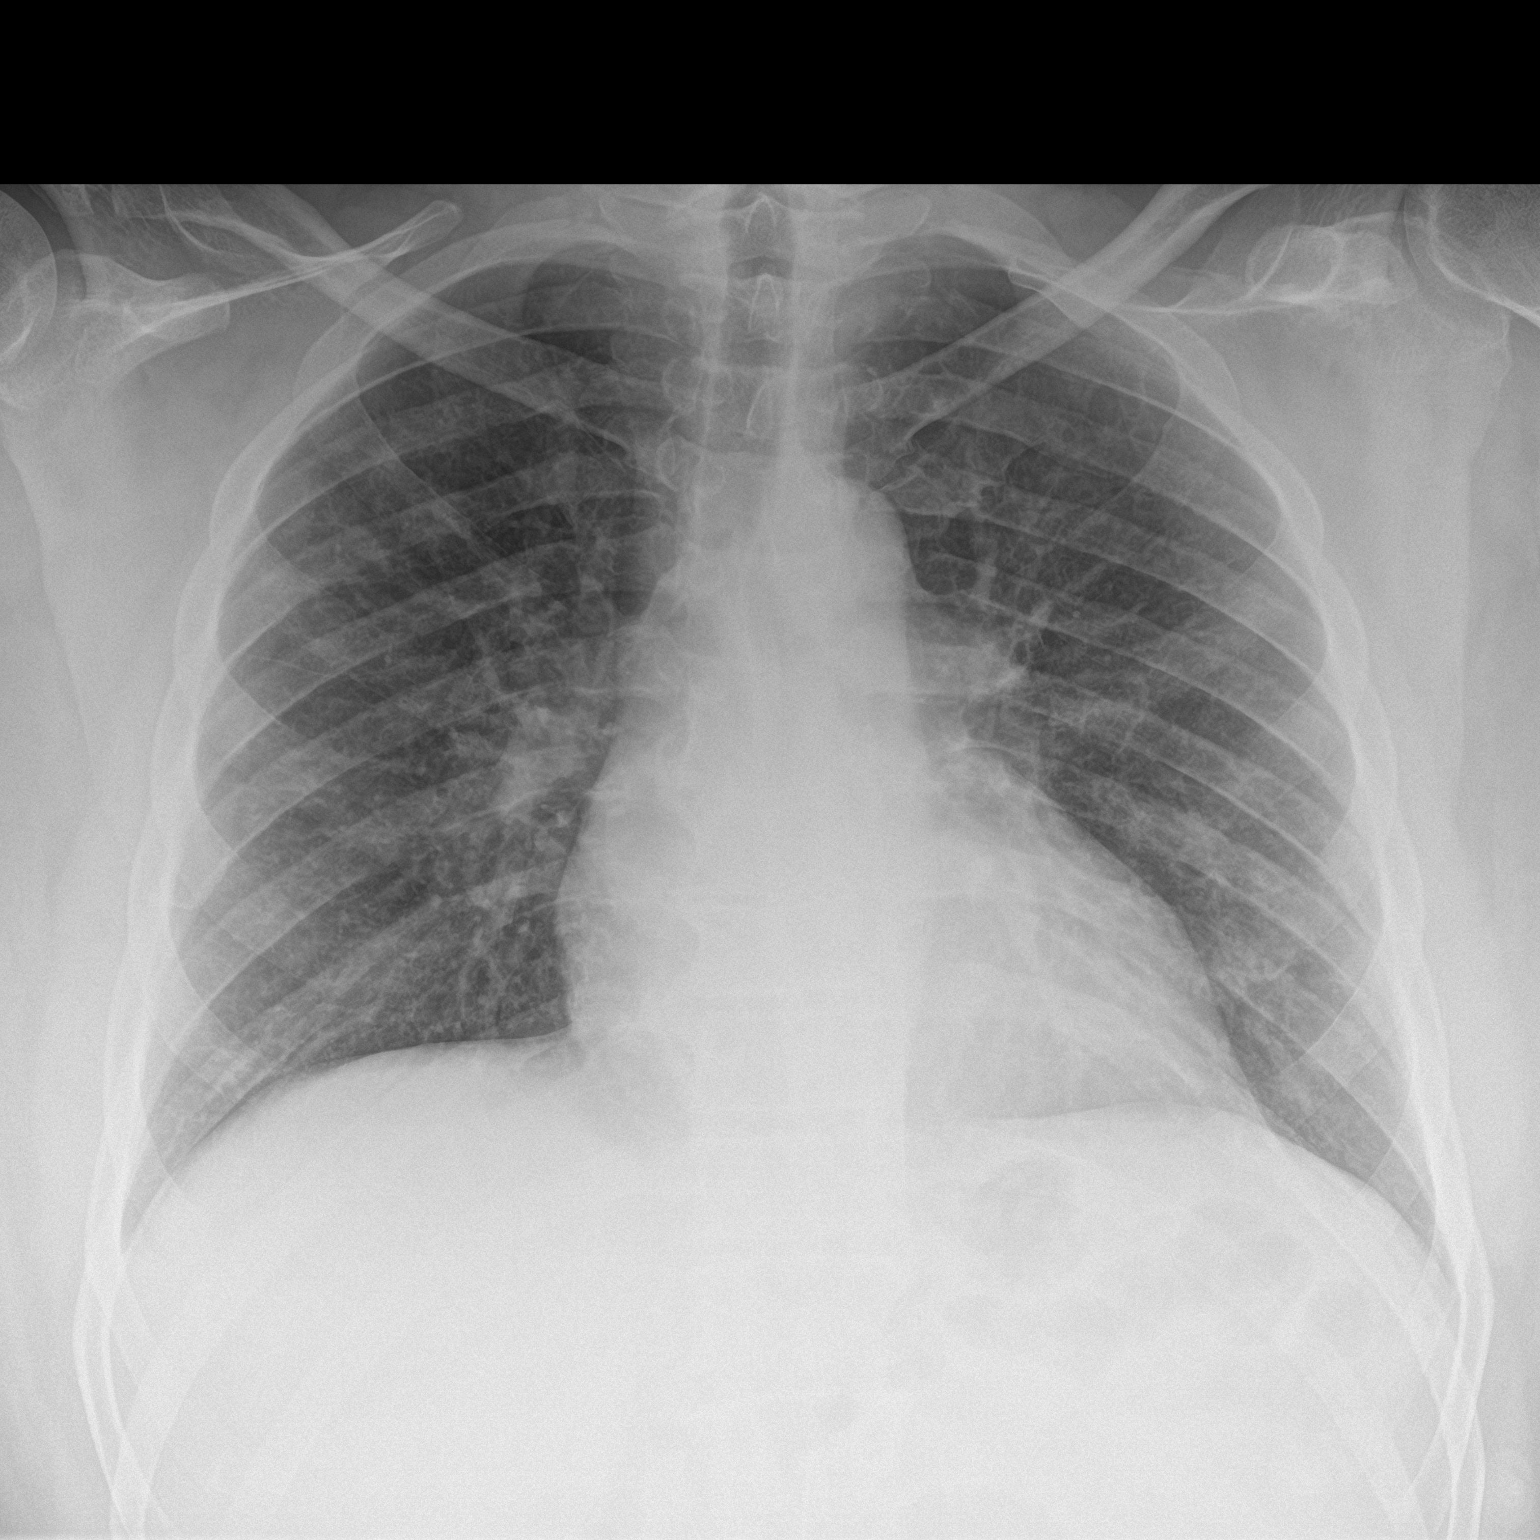

[chest lat]
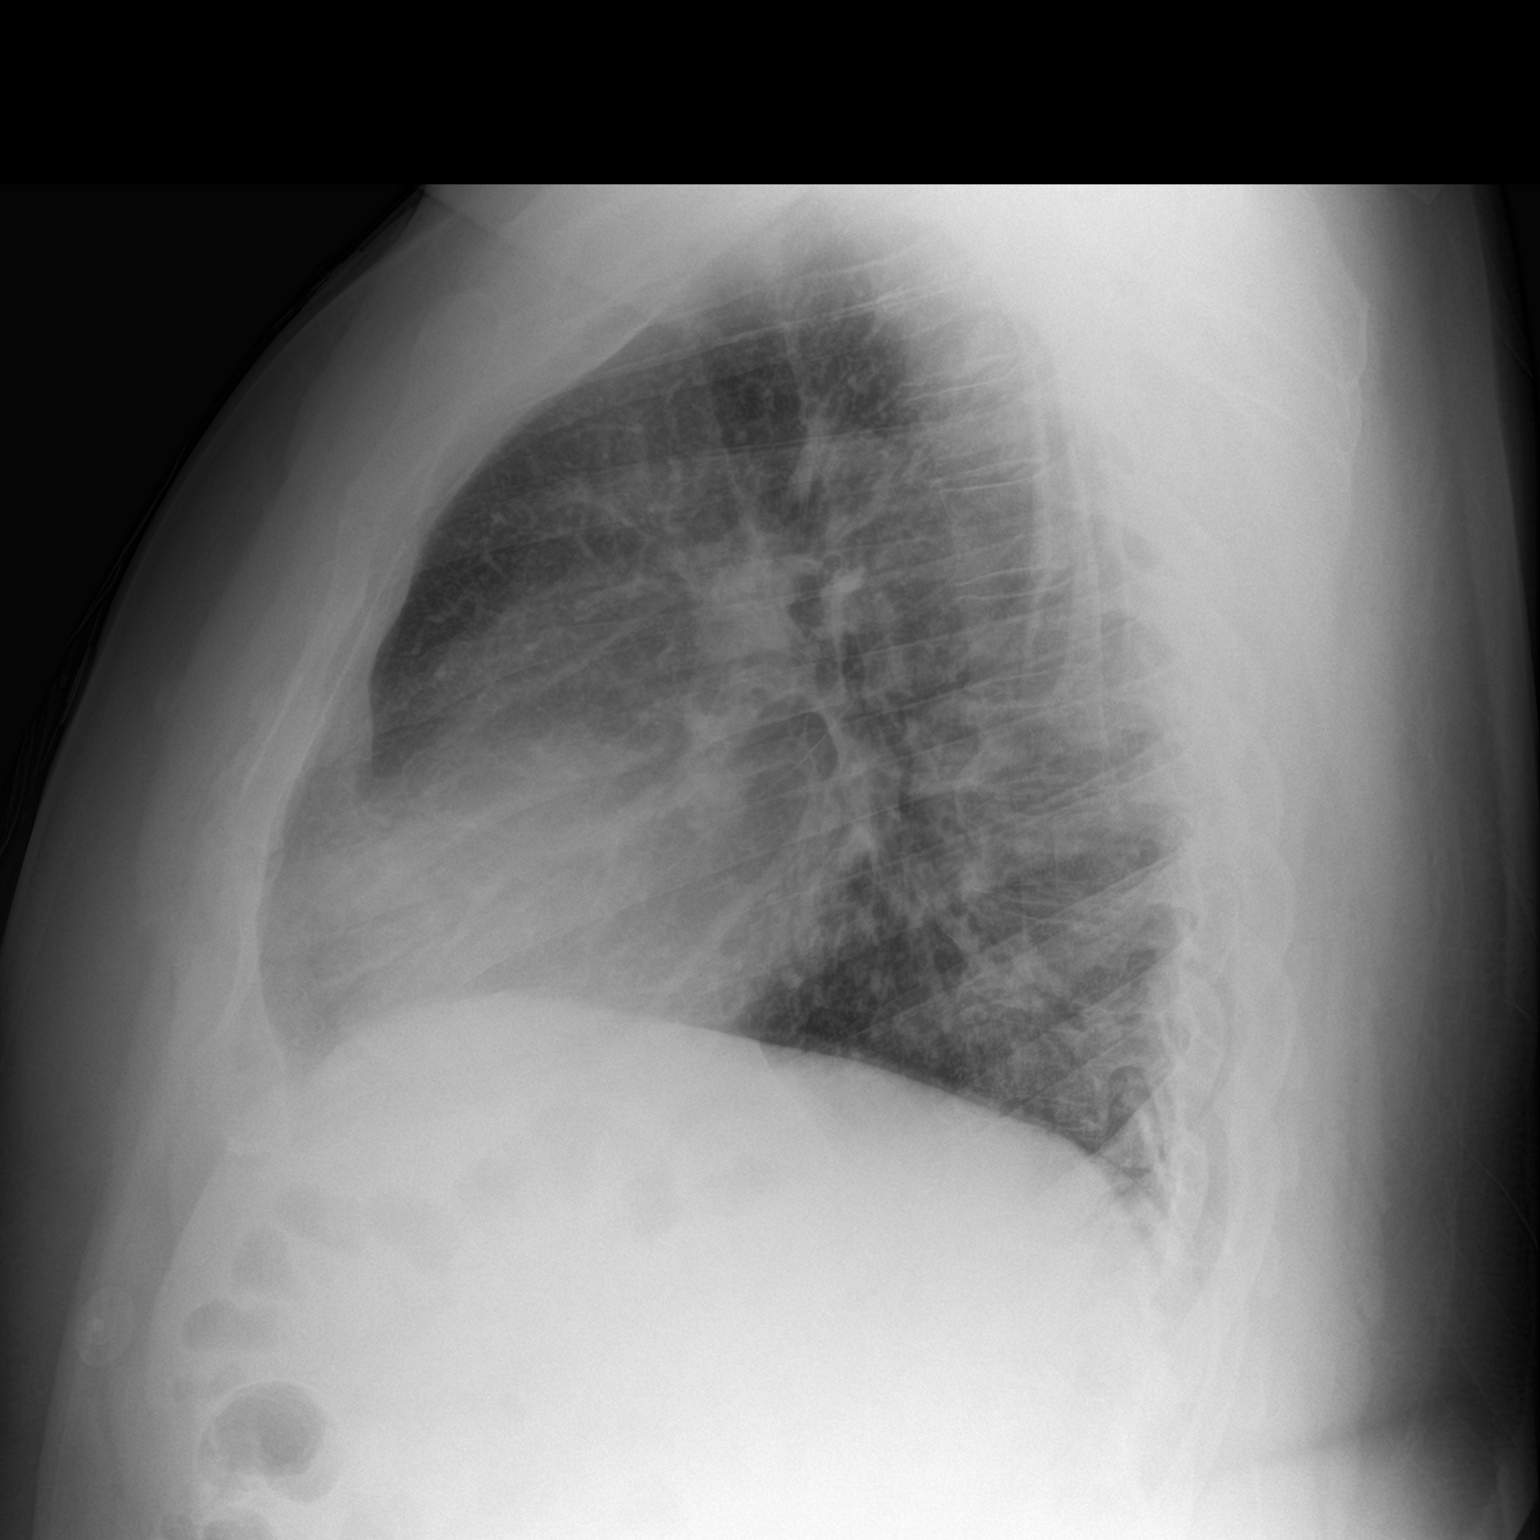

[2 of 2 positions shown; findings below may reference images not displayed]

FINDINGS: Mild interstitial prominence with patchy increased density. No
pleural effusion or pneumothorax. Cardiomediastinal contours are
within normal limits. No significant osseous abnormality.
IMPRESSION: Mild interstitial prominence and patchy increased density, which [DATE] pneumonia.

## 2021-01-28 ENCOUNTER — Other Ambulatory Visit: Payer: Self-pay | Admitting: Cardiology

## 2021-01-28 DIAGNOSIS — I10 Essential (primary) hypertension: Secondary | ICD-10-CM

## 2021-01-28 DIAGNOSIS — I4811 Longstanding persistent atrial fibrillation: Secondary | ICD-10-CM

## 2022-01-13 ENCOUNTER — Ambulatory Visit: Payer: BC Managed Care – PPO | Admitting: Cardiology

## 2022-01-17 NOTE — Progress Notes (Signed)
? ? ?Patient is here for follow up visit. ? ?Subjective:  ? ?'@Patient'  ID: Jeffery Bowman, male    DOB: 18-Feb-1971, 51 y.o.   MRN: [id number] ? ? ?Chief Complaint  ?Patient presents with  ? Atrial Fibrillation  ? Follow-up  ?  1 year  ? ? ?HPI ? ?51 year old African-American man with hypertension, obesity, obstructive sleep apnea noncompliant on CPAP, tobacco abuse, persistent atrial fibrillation. ? ?Recently he has had pain in right side of his neck and chest only with certain movements. He doe snot have any significant pain on walking or any other exertion. Blood pressure is elevated today. He does not check it regularly.  ? ? ?Current Outpatient Medications:  ?  acetaminophen (TYLENOL) 500 MG tablet, Take 2 tablets (1,000 mg total) by mouth every 6 (six) hours as needed for moderate pain., Disp: 30 tablet, Rfl: 0 ?  diltiazem (CARDIZEM CD) 240 MG 24 hr capsule, TAKE 1 CAPSULE BY MOUTH EVERY DAY, Disp: 180 capsule, Rfl: 1 ?  lisinopril-hydrochlorothiazide (ZESTORETIC) 20-12.5 MG tablet, Take 1 tablet by mouth daily., Disp: 90 tablet, Rfl: 3 ? ? ? ?Cardiovascular studies: ? ?EKG 01/18/2022: ?Atrial fibrillation 110 bpm  ?Leftward axis ?Poor R-wave progression -nonspecific -consider old anterior infarct ? ?Echocardiogram 12/29/2020:  ?Normal LV systolic function with visual EF 60-65%. Left ventricle cavity  ?is normal in size. Mild left ventricular hypertrophy. Normal global wall  ?motion. Unable to evaluate diastolic function due to atrial fibrillation.  ?Normal LAP.  ?No significant valvular abnormalities.  ?Compared to prior study dated 05/29/2018 no significant change.  ? ?Recent labs: ?01/19/2020: ?Glucose 156, BUN/Cr 10/1.14. EGFR >60. Na/K 135/3.5.  ?Chol 161, TG 63, HDL 53, LDL 96 ? ? ?Review of Systems  ?Cardiovascular:  Negative for chest pain, dyspnea on exertion, leg swelling, palpitations and syncope.  ?Musculoskeletal:  Positive for joint pain (Left knee).  ? ?   ?Objective:  ? ? ?Vitals:  ? 01/18/22  1001 01/18/22 1006  ?BP: (!) 149/99 (!) 140/112  ?Pulse: 73 (!) 104  ?Resp: 16   ?Temp: 98 ?F (36.7 ?C)   ?SpO2: 98%   ? ? ? Physical Exam ?Vitals and nursing note reviewed.  ?Constitutional:   ?   Appearance: He is well-developed.  ?Neck:  ?   Vascular: No JVD.  ?Cardiovascular:  ?   Rate and Rhythm: Normal rate. Rhythm irregularly irregular.  ?   Pulses: Intact distal pulses.  ?   Heart sounds: Normal heart sounds. No murmur heard. ?Pulmonary:  ?   Effort: Pulmonary effort is normal.  ?   Breath sounds: Normal breath sounds. No wheezing or rales.  ? ? ?  ICD-10-CM   ?1. Longstanding persistent atrial fibrillation ([place])  I48.11   ?  ?2. OSA on CPAP  G47.33   ? Z99.89   ?  ?3. Morbid obesity with BMI of 50.0-59.9, adult ([place])  E66.01   ? B34.19   ?  ?4. Essential hypertension  I10   ?  ? ? ? ?   ?Assessment & Recommendations:  ? ?51 year old African-American man with hypertension, obesity, obstructive sleep apnea noncompliant on CPAP, tobacco abuse, persistent atrial fibrillation. ? ?Chest pain: ?Unlikely to be cardiac. Most likely musculoskeletal. ?Okay to use anti inflammatory agents like ibuprofen for next week or two. ? ?Persistent atrial fibrillation: ?Failed cardioversion X2, failed flecainide 50 mg bid. ?He is not interested in any other anti arrhythmic therapy options at this time.  ?Weight loss and CPAP treatment remain crucial for management of  his Afib. ?Continue diltiazem 240 mg daily. ?CHA2DS2-VASc Score of 1, his stroke risk is low. Not on anticoagulation.Marland Kitchen ?Cardiac monitor showed avg HR 101 bpm (12/2020) ? ?Hypertension: ?Continue lisinopril-HCTZ 20-12.5 mg daily, diltiazem 240 mg daily-as above. ?BP elevated today, pain could be contributing. Recheck in 4 weeks ? ?OSA: ?Emphasized importance of CPAP use. ? ?Morbid obesity: ?Re-emphasized importance of weight loss ? ?Will get labs from PCP. ? ?F/u in 4 weeks ? ?Nigel Mormon, MD ?[facility] ?Pager: [phone] ?Office:  [phone] ?If no answer Cell [phone] ?

## 2022-01-18 ENCOUNTER — Ambulatory Visit: Payer: BC Managed Care – PPO | Admitting: Cardiology

## 2022-01-18 ENCOUNTER — Encounter: Payer: Self-pay | Admitting: Cardiology

## 2022-01-18 VITALS — BP 140/112 | HR 104 | Temp 98.0°F | Resp 16 | Ht 74.0 in | Wt 387.0 lb

## 2022-01-18 DIAGNOSIS — I10 Essential (primary) hypertension: Secondary | ICD-10-CM

## 2022-01-18 DIAGNOSIS — G4733 Obstructive sleep apnea (adult) (pediatric): Secondary | ICD-10-CM

## 2022-01-18 DIAGNOSIS — I4811 Longstanding persistent atrial fibrillation: Secondary | ICD-10-CM

## 2022-02-22 ENCOUNTER — Ambulatory Visit: Payer: BC Managed Care – PPO | Admitting: Cardiology
